# Patient Record
Sex: Female | Born: 1955
Health system: Southern US, Community
[De-identification: ages and names within clinical notes are randomized; demographics above are authoritative.]

## PROBLEM LIST (undated history)

## (undated) DIAGNOSIS — I38 Endocarditis, valve unspecified: Secondary | ICD-10-CM

## (undated) DIAGNOSIS — M810 Age-related osteoporosis without current pathological fracture: Secondary | ICD-10-CM

## (undated) DIAGNOSIS — K089 Disorder of teeth and supporting structures, unspecified: Secondary | ICD-10-CM

## (undated) DIAGNOSIS — I10 Essential (primary) hypertension: Secondary | ICD-10-CM

## (undated) HISTORY — DX: Disorder of teeth and supporting structures, unspecified: K08.9

## (undated) HISTORY — PX: COLONOSCOPY: SHX174

## (undated) HISTORY — DX: Age-related osteoporosis without current pathological fracture: M81.0

## (undated) HISTORY — PX: OTHER SURGICAL HISTORY: SHX169

## (undated) HISTORY — DX: Endocarditis, valve unspecified: I38

---

## 2006-01-02 ENCOUNTER — Ambulatory Visit: Payer: Self-pay | Admitting: Internal Medicine

## 2006-01-11 ENCOUNTER — Ambulatory Visit: Payer: Self-pay | Admitting: Internal Medicine

## 2006-02-27 ENCOUNTER — Ambulatory Visit: Payer: Self-pay | Admitting: Internal Medicine

## 2006-03-14 ENCOUNTER — Encounter: Payer: Self-pay | Admitting: Cardiovascular Disease

## 2006-03-14 ENCOUNTER — Ambulatory Visit: Payer: Self-pay

## 2006-03-14 ENCOUNTER — Encounter: Payer: Self-pay | Admitting: Internal Medicine

## 2006-04-17 ENCOUNTER — Ambulatory Visit: Payer: Self-pay | Admitting: Internal Medicine

## 2006-11-03 ENCOUNTER — Ambulatory Visit: Payer: Self-pay | Admitting: Internal Medicine

## 2006-11-16 ENCOUNTER — Ambulatory Visit: Payer: Self-pay | Admitting: Internal Medicine

## 2006-12-14 DIAGNOSIS — I10 Essential (primary) hypertension: Secondary | ICD-10-CM

## 2006-12-14 DIAGNOSIS — R030 Elevated blood-pressure reading, without diagnosis of hypertension: Secondary | ICD-10-CM | POA: Insufficient documentation

## 2006-12-14 DIAGNOSIS — R9389 Abnormal findings on diagnostic imaging of other specified body structures: Secondary | ICD-10-CM | POA: Insufficient documentation

## 2007-03-01 ENCOUNTER — Ambulatory Visit: Payer: Self-pay | Admitting: Internal Medicine

## 2007-03-30 LAB — CONVERTED CEMR LAB: Pap Smear: NORMAL

## 2007-07-03 ENCOUNTER — Ambulatory Visit: Payer: Self-pay | Admitting: Internal Medicine

## 2007-07-05 ENCOUNTER — Encounter: Payer: Self-pay | Admitting: Internal Medicine

## 2007-07-09 ENCOUNTER — Ambulatory Visit: Payer: Self-pay | Admitting: Internal Medicine

## 2007-07-12 LAB — CONVERTED CEMR LAB
AST: 24 units/L (ref 0–37)
BUN: 10 mg/dL (ref 6–23)
Basophils Relative: 0.9 % (ref 0.0–1.0)
CO2: 30 meq/L (ref 19–32)
Calcium: 9.6 mg/dL (ref 8.4–10.5)
Cholesterol: 173 mg/dL (ref 0–200)
Eosinophils Absolute: 0.2 10*3/uL (ref 0.0–0.6)
GFR calc Af Amer: 134 mL/min
GFR calc non Af Amer: 111 mL/min
HDL: 66.4 mg/dL (ref >39.0)
Hemoglobin: 14 g/dL (ref 12.0–15.0)
Lymphocytes Relative: 46 % (ref 12.0–46.0)
MCHC: 34.5 g/dL (ref 30.0–36.0)
MCV: 86.2 fL (ref 78.0–100.0)
Monocytes Absolute: 0.3 10*3/uL (ref 0.2–0.7)
Monocytes Relative: 8.6 % (ref 3.0–11.0)
Neutro Abs: 1.3 10*3/uL — ABNORMAL LOW (ref 1.4–7.7)
Platelets: 192 10*3/uL (ref 150–400)
Potassium: 4.5 meq/L (ref 3.5–5.1)
TSH: 0.97 microintl units/mL (ref 0.35–5.50)
Triglycerides: 72 mg/dL (ref 0–149)
VLDL: 14 mg/dL (ref 0–40)

## 2008-01-16 ENCOUNTER — Telehealth (INDEPENDENT_AMBULATORY_CARE_PROVIDER_SITE_OTHER): Payer: Self-pay | Admitting: *Deleted

## 2008-10-10 ENCOUNTER — Ambulatory Visit: Payer: Self-pay | Admitting: Internal Medicine

## 2008-10-13 ENCOUNTER — Ambulatory Visit: Payer: Self-pay | Admitting: Internal Medicine

## 2008-10-13 LAB — CONVERTED CEMR LAB: Vit D, 25-Hydroxy: 55 ng/mL (ref 30–89)

## 2008-10-16 LAB — CONVERTED CEMR LAB
ALT: 24 units/L (ref 0–35)
BUN: 9 mg/dL (ref 6–23)
CO2: 27 meq/L (ref 19–32)
Eosinophils Relative: 5.9 % — ABNORMAL HIGH (ref 0.0–5.0)
Glucose, Bld: 94 mg/dL (ref 70–99)
HDL: 69.4 mg/dL (ref >39.0)
Hemoglobin: 14.2 g/dL (ref 12.0–15.0)
LDL Cholesterol: 93 mg/dL (ref 0–99)
Lymphocytes Relative: 52.5 % — ABNORMAL HIGH (ref 12.0–46.0)
Monocytes Relative: 8.4 % (ref 3.0–12.0)
Platelets: 173 10*3/uL (ref 150–400)
Potassium: 4.3 meq/L (ref 3.5–5.1)
RDW: 12.4 % (ref 11.5–14.6)
Sodium: 140 meq/L (ref 135–145)
TSH: 1.1 microintl units/mL (ref 0.35–5.50)
Total CHOL/HDL Ratio: 2.5
Triglycerides: 52 mg/dL (ref 0–149)
WBC: 3.6 10*3/uL — ABNORMAL LOW (ref 4.5–10.5)

## 2009-05-29 LAB — CONVERTED CEMR LAB: Pap Smear: NORMAL

## 2009-10-30 ENCOUNTER — Ambulatory Visit: Payer: Self-pay | Admitting: Internal Medicine

## 2009-11-02 ENCOUNTER — Ambulatory Visit: Payer: Self-pay | Admitting: Internal Medicine

## 2009-11-02 ENCOUNTER — Encounter (INDEPENDENT_AMBULATORY_CARE_PROVIDER_SITE_OTHER): Payer: Self-pay | Admitting: *Deleted

## 2009-11-02 LAB — CONVERTED CEMR LAB
Glucose, Urine, Semiquant: NEGATIVE
Specific Gravity, Urine: 1.02
WBC Urine, dipstick: NEGATIVE
pH: 6

## 2009-11-03 ENCOUNTER — Telehealth: Payer: Self-pay | Admitting: Internal Medicine

## 2009-11-04 ENCOUNTER — Encounter: Payer: Self-pay | Admitting: Internal Medicine

## 2009-11-05 LAB — CONVERTED CEMR LAB
BUN: 8 mg/dL (ref 6–23)
Basophils Relative: 0.9 % (ref 0.0–3.0)
Chloride: 108 meq/L (ref 96–112)
Eosinophils Relative: 5.3 % — ABNORMAL HIGH (ref 0.0–5.0)
GFR calc non Af Amer: 92.08 mL/min (ref >60)
Glucose, Bld: 91 mg/dL (ref 70–99)
HCT: 41.3 % (ref 36.0–46.0)
Hemoglobin: 13.8 g/dL (ref 12.0–15.0)
MCV: 88.6 fL (ref 78.0–100.0)
Monocytes Absolute: 0.3 10*3/uL (ref 0.1–1.0)
Neutrophils Relative %: 38.1 % — ABNORMAL LOW (ref 43.0–77.0)
Potassium: 4.2 meq/L (ref 3.5–5.1)
RBC: 4.66 M/uL (ref 3.87–5.11)
VLDL: 20.2 mg/dL (ref 0.0–40.0)
WBC: 3.5 10*3/uL — ABNORMAL LOW (ref 4.5–10.5)

## 2009-11-09 ENCOUNTER — Encounter: Payer: Self-pay | Admitting: Internal Medicine

## 2009-11-09 ENCOUNTER — Ambulatory Visit: Payer: Self-pay

## 2009-11-17 ENCOUNTER — Encounter (INDEPENDENT_AMBULATORY_CARE_PROVIDER_SITE_OTHER): Payer: Self-pay | Admitting: *Deleted

## 2009-11-18 ENCOUNTER — Ambulatory Visit: Payer: Self-pay | Admitting: Internal Medicine

## 2009-12-02 ENCOUNTER — Ambulatory Visit: Payer: Self-pay | Admitting: Internal Medicine

## 2009-12-07 ENCOUNTER — Encounter: Payer: Self-pay | Admitting: Internal Medicine

## 2010-05-31 ENCOUNTER — Encounter: Payer: Self-pay | Admitting: Internal Medicine

## 2010-06-01 ENCOUNTER — Encounter: Payer: Self-pay | Admitting: Internal Medicine

## 2010-06-16 ENCOUNTER — Encounter: Payer: Self-pay | Admitting: Internal Medicine

## 2010-06-22 DIAGNOSIS — R000 Tachycardia, unspecified: Secondary | ICD-10-CM | POA: Insufficient documentation

## 2010-06-23 ENCOUNTER — Ambulatory Visit: Payer: Self-pay | Admitting: Internal Medicine

## 2010-06-29 ENCOUNTER — Encounter: Payer: Self-pay | Admitting: Internal Medicine

## 2010-06-29 ENCOUNTER — Ambulatory Visit: Payer: Self-pay | Admitting: Cardiovascular Disease

## 2010-06-29 ENCOUNTER — Ambulatory Visit: Payer: Self-pay

## 2010-06-29 ENCOUNTER — Ambulatory Visit (HOSPITAL_COMMUNITY): Admission: RE | Admit: 2010-06-29 | Discharge: 2010-06-29 | Payer: Self-pay | Admitting: Internal Medicine

## 2010-06-30 ENCOUNTER — Telehealth: Payer: Self-pay | Admitting: Internal Medicine

## 2010-08-06 ENCOUNTER — Ambulatory Visit: Payer: Self-pay | Admitting: Internal Medicine

## 2010-09-28 NOTE — Progress Notes (Signed)
Summary: test results  Phone Note Call from Patient Call back at Home Phone [phone]   Caller: Patient Reason for Call: Lab or Test Results Initial call taken by: Judie Grieve,  June 30, 2010 3:17 PM  Follow-up for Phone Call        lmom with normal results Dennis Bast, RN, BSN  July 01, 2010 10:15 AM

## 2010-09-28 NOTE — Procedures (Signed)
Summary: Colonoscopy  Patient: Melissa Russell Note: All result statuses are Final unless otherwise noted.  Tests: (1) Colonoscopy (COL)   COL Colonoscopy           DONE     [facility]     [address]     [place], [place]  [address]           COLONOSCOPY PROCEDURE REPORT           PATIENT:  Melissa, Russell  MR#:  [id number]     BIRTHDATE:  October 15, 1955, 53 yrs. old  GENDER:  female     ENDOSCOPIST:  Iva Boop, MD, [facility]     REF. BY:  Willow Ora, M.D.     PROCEDURE DATE:  12/02/2009     PROCEDURE:  Colonoscopy with snare polypectomy     ASA CLASS:  Class II     INDICATIONS:  Elevated Risk Screening, family history of colon     cancer father diagnosed with colon cancer early 24's, age of onset     not clear but in 88's presumably     MEDICATIONS:   Fentanyl 75 mcg IV, Versed 7 mg IV           DESCRIPTION OF PROCEDURE:   After the risks benefits and     alternatives of the procedure were thoroughly explained, informed     consent was obtained.  Digital rectal exam was performed and     revealed no abnormalities.   The LB CF-[id number] endoscope     was introduced through the anus and advanced to the cecum, which     was identified by both the appendix and ileocecal valve, without     limitations.  The quality of the prep was good, using MoviPrep.     The instrument was then slowly withdrawn as the colon was fully     examined. Insertion: 5:14 mins Withdrawal: 9:51 mins     <<PROCEDUREIMAGES>>           FINDINGS:  A diminutive polyp was found at the splenic flexure. It     was 5 mm in size. Polyp was snared without cautery. Retrieval was     successful. Moderate diverticulosis was found in the sigmoid     colon.  This was otherwise a normal examination of the colon.     Retroflexed views in the rectum revealed no abnormalities.    The     scope was then withdrawn from the patient and the procedure     completed.           COMPLICATIONS:  None     ENDOSCOPIC  IMPRESSION:     1) 5 mm diminutive polyp at the splenic flexure - removed     2) Moderate diverticulosis in the sigmoid colon     3) Otherwise normal examination - excellent prep           4) Family history of colon cancer - father           REPEAT EXAM:  In for Colonoscopy, pending biopsy results. likely 5     years           Iva Boop, MD, [facility]           CC:  Willow Ora, MD     The Patient           n.     eSIGNED:   Iva Boop at 12/02/2009 11:30  AM           Cammy Copa, [id number]  Note: An exclamation mark (!) indicates a result that was not dispersed into the flowsheet. Document Creation Date: 12/02/2009 11:31 AM _______________________________________________________________________  (1) Order result status: Final Collection or observation date-time: 12/02/2009 11:21 Requested date-time:  Receipt date-time:  Reported date-time:  Referring Physician:   Ordering Physician: Stan Head [phone]) Specimen Source:  Source: Launa Grill Order Number: [id number] Lab site:   Appended Document: Colonoscopy     Procedures Next Due Date:    Colonoscopy: 11/2014

## 2010-09-28 NOTE — Miscellaneous (Signed)
Summary: [facility] Previsit/prep  Clinical Lists Changes  Medications: Added new medication of MOVIPREP 100 GM  SOLR (PEG-KCL-NACL-NASULF-NA ASC-C) As per prep instructions. - Signed Rx of MOVIPREP 100 GM  SOLR (PEG-KCL-NACL-NASULF-NA ASC-C) As per prep instructions.;  #1 x 0;  Signed;  Entered by: Wyona Almas RN;  Authorized by: Iva Boop MD, [facility];  Method used: [phone] [address] [place], [place], [place]  [address], Ph: [phone] or [phone], Fax: [phone] Observations: Added new observation of ALLERGY REV: Done (11/18/2009 13:08)    Prescriptions: MOVIPREP 100 GM  SOLR (PEG-KCL-NACL-NASULF-NA ASC-C) As per prep instructions.  #1 x 0   Entered by:   Wyona Almas RN   Authorized by:   Iva Boop MD, [facility]   Signed by:   Wyona Almas RN on 11/18/2009   Method used:   [facility]        [facility] [phone]ail)       [address]       [place], [place]  [address]       Ph: [phone] or [phone]       Fax: [phone]   RxID:   [id number]

## 2010-09-28 NOTE — Assessment & Plan Note (Signed)
Summary: Melissa Russell///SPH   Vital Signs:  Patient profile:   55 year old female Height:      66 inches Weight:      129.2 pounds BMI:     20.93 Pulse rate:   96 / minute BP sitting:   144 / 82  Vitals Entered By: Shary Decamp (October 30, 2009 1:57 PM) CC: Melissa Russell - no pap   History of Present Illness: Melissa Russell patient wonders if she is loosing hair  from metoprolol  Preventive Screening-Counseling & Management  Caffeine-Diet-Exercise     Caffeine use/day: 0     Does Patient Exercise: yes     Times/week: 5      Drug Use:  no.    Allergies: 1)  ! Augmentin  Past History:  Past Medical History: Reviewed history from 12/14/2006 and no changes required. Anxiety Hypertension  Past Surgical History: Reviewed history from 10/10/2008 and no changes required. 7-07: ECHO EF 55%, minimal MR, CARDIOLITE NEG - INCREASED BP  Family History: Reviewed history from 10/10/2008 and no changes required. colon ca-- father Dx age 64 aprox  Breast ca--no MOTHER - BLADDER CA DM - AUNT MI--no Aneurysm (chest)-- bro  Social History: Reviewed history from 10/10/2008 and no changes required. Married no kids@ [employer]  Born in [place], [place] ; husband from [place]  tobacco--no ETOH-- socially  exercise-- walks daily weather permit  diet-- healthy Does Patient Exercise:  yes Drug Use:  no Caffeine use/day:  0  Review of Systems General:  Denies fever and weight loss. CV:  Denies chest pain or discomfort and swelling of feet. Resp:  Denies cough and wheezing. GI:  Denies bloody stools, diarrhea, and nausea. GU:  sees gyn does SBE . Psych:  Denies anxiety and depression.  Physical Exam  General:  alert, well-developed, and well-nourished.   Neck:  no masses, no thyromegaly, and normal carotid upstroke.   Lungs:  normal respiratory effort, no intercostal retractions, no accessory muscle use, and normal breath sounds.   Heart:  normal rate, regular rhythm, no  murmur, and no gallop.   Abdomen:  soft, non-tender, no distention, no guarding, and no rigidity.  palpable nontender aorta in the epigastric area.  No bruit Extremities:  no edema Psych:  Cognition and judgment appear intact. Alert and cooperative with normal attention span and concentration.  not anxious appearing and not depressed appearing.     Impression & Recommendations:  Problem # 1:  HEALTH SCREENING (ICD-V70.0)  Td 07 had a colonoscopy in 2005 apparently for screening purpose, was done in [place] father has a h/o  colon cancer recommend colonoscopy female care per gynecology  has a healthy lifestyle-- praised loosing hair from metoprolol? issue search in Up-to-date, it is unlikely that she's loosing hair from  metoprolol palpable Ao (noted also 2 years ago) -- r/o AAA, rec U/S , patient quite reluctant, will think about it   Orders: Gastroenterology Referral (GI)  Complete Medication List: 1)  Metoprolol Succinate 50 Mg Tb24 (Metoprolol succinate) .... One p.o. b.i.d. 2)  Calcium and Vitd Otc Daily   Patient Instructions: 1)  please come back fasting 2)  FLP, CBC, TSH, BMP, AST ALT UA and Udip ----Dx  complete physical 3)  Please schedule a follow-up appointment in 1 year.  Prescriptions: METOPROLOL SUCCINATE 50 MG  TB24 (METOPROLOL SUCCINATE) one p.o. b.i.d.  #180 x 3   Entered and Authorized by:   Nolon Rod. Shawniece Oyola MD   Signed by:   Elita Quick  Elisabeth Cara MD on 10/30/2009   Method used:   Print then Give to Patient   RxID:   [id number]    Preventive Care Screening  Mammogram:    Date:  05/29/2009    Results:  normal   Pap Smear:    Date:  05/29/2009    Results:  normal   Prior Values:    Pap Smear:  normal (04/29/2008)    Mammogram:  normal (04/29/2008)    Colonoscopy:  normal - per pt (08/30/2003)    Last Tetanus Booster:  Historical (01/02/2006)     Risk Factors:  Tobacco use:  never Drug use:  no Caffeine use:  0 drinks per day Alcohol use:  yes     Type:  SOCIALLY Exercise:  yes    Times per week:  5  Colonoscopy History:    Date of Last Colonoscopy:  08/30/2003  Mammogram History:     Date of Last Mammogram:  05/29/2009    Results:  normal   PAP Smear History:     Date of Last PAP Smear:  05/29/2009    Results:  normal

## 2010-09-28 NOTE — Medication Information (Signed)
Summary: Nonadherence with Metoprolol/[facility]  Nonadherence with Metoprolol/[facility]   Imported By: Lanelle Bal 06/10/2010 09:02:50  _____________________________________________________________________  External Attachment:    Type:   Image     Comment:   External Document

## 2010-09-28 NOTE — Miscellaneous (Signed)
Summary: Orders Update  Clinical Lists Changes  Orders: Added new Test order of Abdominal Aorta Duplex (Abd Aorta Duplex) - Signed 

## 2010-09-28 NOTE — Assessment & Plan Note (Signed)
Summary: nep/ tackycardia pt has bcbs/ gd   Visit Type:  Initial Consult Primary Provider:  Jackalyn Lombard, MD   History of Present Illness: [name] is a pleasant 55 yo WF with a h/o HTN who presents today for cardiology consultation.  She is referred for evaluation of palpitations.  She reports initially being diagnosed with HTN four years ago.  She was placed on metoprolol at that time.  She reports having alopecia, fatigue, and consitpiation which she attributed to metoprolol.  She stopped metoprolol 8/11 and was placed on lisinopril.  Since starting starting lisnopril, she reports developing orthostatic dizziness and palpitations.  She describes her palpitations as an awareness of her heart "pounding" and beating "fast".  She was taken off of lisinopril and initiated on bystolic.  She states that since this change, she has done very well.  Her heart "pounding" and dizziness have resolved.  She has returned to her previous health state and is otherwise without complaint today. She denies symptoms of  chest pain, shortness of breath, orthopnea, PND, lower extremity edema,  presyncope, syncope, or neurologic sequela. The patient is  otherwise without complaint today.   Current Medications (verified): 1)  Bystolic 5 Mg Tabs (Nebivolol Hcl) .... Once Daily  Allergies: 1)  ! Augmentin  Past History:  Past Medical History: Hypertension diverticulosis Hyperlipidemia (pt unaware of this)  Past Surgical History: 7-07: ECHO EF 55%, minimal MR, CARDIOLITE NEG - INCREASED BP  no surgeries  Family History: colon ca-- father Dx age 94 aprox  Breast ca--no MOTHER - BLADDER CA DM - AUNT MI--no Brother died of a ruptured dissection of the aorta  Social History: Married and lives in [place]. volunteers, worked as a Engineer, site in [place] Born in [place], [place] ; husband from [place]  tobacco--no ETOH-- socially  exercise-- walks daily weather permit  diet-- healthy    Review of Systems       All systems are reviewed and negative except as listed in the HPI.   Vital Signs:  Patient profile:   55 year old female Height:      66 inches Weight:      128 pounds BMI:     20.73 Pulse rate:   96 / minute BP sitting:   138 / 80  (left arm)  Vitals Entered By: Laurance Flatten CMA (June 23, 2010 3:43 PM)  Physical Exam  General:  thin Head:  normocephalic and atraumatic Eyes:  PERRLA/EOM intact; conjunctiva and lids normal. Mouth:  Teeth, gums and palate normal. Oral mucosa normal. Neck:  Neck supple, no JVD. No masses, thyromegaly or abnormal cervical nodes. Lungs:  Clear bilaterally to auscultation and percussion. Heart:  Non-displaced PMI, chest non-tender; regular rate and rhythm, S1, S2 without murmurs, rubs or gallops. Carotid upstroke normal, no bruit. Normal abdominal aortic size, no bruits. Femorals normal pulses, no bruits. Pedals normal pulses. No edema, no varicosities. Abdomen:  Bowel sounds positive; abdomen soft and non-tender without masses, organomegaly, or hernias noted. No hepatosplenomegaly. Msk:  Back normal, normal gait. Muscle strength and tone normal. Pulses:  pulses normal in all 4 extremities Extremities:  No clubbing or cyanosis. Neurologic:  Alert and oriented x 3. Skin:  Intact without lesions or rashes. Cervical Nodes:  no significant adenopathy Psych:  Normal affect.   EKG  Procedure date:  05/31/2010  Findings:      sinus rhythm 87 bpm, PR 134, QRS 80, QTc 411, otherwise normal ekg  Impression & Recommendations:  Problem # 1:  HYPERTENSION (ICD-401.9) Ms Goens' blood pressure is above goal today.  I had a long discussion with her about the importance of good BP control.  She states that she does not like to take medicines and is very reluctant to make any changes today.  I have recommended that we simply increase bystolic today as she is tolerating this medicine presently, though she declines. I have  recommended salt restriction, avoidance of NSAIDs, and avoidance of decongestants this winter. She will keep a log of BP recordings over the next few weeks and will return with these for further consideration of adjusting BP medication in several weeks.   She has a slightly marfanoid body habitous and a brother that died or arotic dissection.  I have reviewed her abdominal US from 3/11 which was normal.  We will repeat an echo at this time to evaluate the aortic root.  If this is normal, no further testing is planned at this time.  Problem # 2:  TACHYCARDIA (ICD-785.0) Assessment: Unchanged She states that her palpitations have resolved with bystolic and upon cessation of her ACE inhibitor. I have made no further changes today. We will repeat an echo (I have reviewed echo and nuclear study from 2007 which were both normal at that time).  If her echo is normal, no further workup is planned unless symptoms return.  Other Orders: Echocardiogram (Echo)  Patient Instructions: 1)  Your physician recommends that you schedule a follow-up appointment in: 6 WEEKS WITH DR Johney Frame 2)  Your physician has requested that you have an echocardiogram.  Echocardiography is a painless test that uses sound waves to create images of your heart. It provides your doctor with information about the size and shape of your heart and how well your heart's chambers and valves are working.  This procedure takes approximately one hour. There are no restrictions for this procedure. 3)  Your physician has requested that you regularly monitor and record your blood pressure readings at home.  Please use the same machine at the same time of day to check your readings and record them to bring to your follow-up visit.

## 2010-09-28 NOTE — Progress Notes (Signed)
Summary: PATIENT REQUEST U/S  Phone Note Call from Patient   Caller: Patient Call For: Jose E. Paz MD Reason for Call: Referral Summary of Call: PATIENT CALLING, IS NOW INTERESTED IN HAVING THE U/S TO LOOK AT HER AAA, AS DISCUSSED AT LAST OV.  OK TO REFER? Initial call taken by: Magdalen Spatz [facility],  November 03, 2009 8:29 AM  Follow-up for Phone Call        yes Follow-up by: Shary Decamp,  November 03, 2009 8:38 AM

## 2010-09-28 NOTE — Letter (Signed)
Summary: [facility] Instructions  [facility]  [address] [place], [place] [address]   Phone: [phone]  Fax: [phone]       Melissa Russell    12-08-1955    MRN: [id number]        Procedure Day [name]:  [facility]  12/02/09     Arrival Time:  10:00AM     Procedure Time:  11:00AM     Location of Procedure:                    _X _  [facility] (4th Floor)                        PREPARATION FOR COLONOSCOPY WITH MOVIPREP   Starting 5 days prior to your procedure 11/27/09 do not eat nuts, seeds, popcorn, corn, beans, peas,  salads, or any raw vegetables.  Do not take any fiber supplements (e.g. Metamucil, Citrucel, and Benefiber).  THE DAY BEFORE YOUR PROCEDURE         DATE: 12/01/09  DAY: TUESDAY  1.  Drink clear liquids the entire day-NO SOLID FOOD  2.  Do not drink anything colored red or purple.  Avoid juices with pulp.  No orange juice.  3.  Drink at least 64 oz. (8 glasses) of fluid/clear liquids during the day to prevent dehydration and help the prep work efficiently.  CLEAR LIQUIDS INCLUDE: Water Jello Ice Popsicles Tea (sugar ok, no milk/cream) Powdered fruit flavored drinks Coffee (sugar ok, no milk/cream) Gatorade Juice: apple, white grape, white cranberry  Lemonade Clear bullion, consomm, broth Carbonated beverages (any kind) Strained chicken noodle soup Hard Candy                             4.  In the morning, mix first dose of MoviPrep solution:    Empty 1 Pouch A and 1 Pouch B into the disposable container    Add lukewarm drinking water to the top line of the container. Mix to dissolve    Refrigerate (mixed solution should be used within 24 hrs)  5.  Begin drinking the prep at 5:00 p.m. The MoviPrep container is divided by 4 marks.   Every 15 minutes drink the solution down to the next mark (approximately 8 oz) until the full liter is complete.   6.  Follow completed prep with 16 oz of clear liquid of your choice  (Nothing red or purple).  Continue to drink clear liquids until bedtime.  7.  Before going to bed, mix second dose of MoviPrep solution:    Empty 1 Pouch A and 1 Pouch B into the disposable container    Add lukewarm drinking water to the top line of the container. Mix to dissolve    Refrigerate  THE DAY OF YOUR PROCEDURE      DATE: 12/02/09  DAY: WEDNESDAY  Beginning at 6:00a.m. (5 hours before procedure):         1. Every 15 minutes, drink the solution down to the next mark (approx 8 oz) until the full liter is complete.  2. Follow completed prep with 16 oz. of clear liquid of your choice.    3. You may drink clear liquids until 9:00AM (2 HOURS BEFORE PROCEDURE).   MEDICATION INSTRUCTIONS  Unless otherwise instructed, you should take regular prescription medications with a small sip of water   as early as possible the morning  of your procedure.         OTHER INSTRUCTIONS  You will need a responsible adult at least 57 years of age to accompany you and drive you home.   This person must remain in the waiting room during your procedure.  Wear loose fitting clothing that is easily removed.  Leave jewelry and other valuables at home.  However, you may wish to bring a book to read or  an iPod/MP3 player to listen to music as you wait for your procedure to start.  Remove all body piercing jewelry and leave at home.  Total time from sign-in until discharge is approximately 2-3 hours.  You should go home directly after your procedure and rest.  You can resume normal activities the  day after your procedure.  The day of your procedure you should not:   Drive   Make legal decisions   Operate machinery   Drink alcohol   Return to work  You will receive specific instructions about eating, activities and medications before you leave.    The above instructions have been reviewed and explained to me by  Wyona Almas RN  November 18, 2009 2:07 PM     I fully  understand and can verbalize these instructions _____________________________ Date _________

## 2010-09-28 NOTE — Letter (Signed)
Summary: Previsit letter  [facility]  [address] [place], [place] [address]   Phone: [phone]  Fax: [phone]       11/02/2009 MRN: [id number]  TACARA HADLOCK [address] [name], [place]  [address]  Dear Ms. Mone,  Welcome to the Gastroenterology Division at [facility].    You are scheduled to see a nurse for your pre-procedure visit on 11-18-09 at 1:30p.m. on the 3rd floor at [facility], [address]  We ask that you try to arrive at our office 15 minutes prior to your appointment time to allow for check-in.  Your nurse visit will consist of discussing your medical and surgical history, your immediate family medical history, and your medications.    Please bring a complete list of all your medications or, if you prefer, bring the medication bottles and we will list them.  We will need to be aware of both prescribed and over the counter drugs.  We will need to know exact dosage information as well.  If you are on blood thinners (Coumadin, Plavix, Aggrenox, Ticlid, etc.) please call our office today/prior to your appointment, as we need to consult with your physician about holding your medication.   Please be prepared to read and sign documents such as consent forms, a financial agreement, and acknowledgement forms.  If necessary, and with your consent, a friend or relative is welcome to sit-in on the nurse visit with you.  Please bring your insurance card so that we may make a copy of it.  If your insurance requires a referral to see a specialist, please bring your referral form from your primary care physician.  No co-pay is required for this nurse visit.     If you cannot keep your appointment, please call [phone] to cancel or reschedule prior to your appointment date.  This allows [place] the opportunity to schedule an appointment for another patient in need of care.    Thank you for choosing [facility] for your medical  needs.  We appreciate the opportunity to care for you.  Please visit [place] at our website  to learn more about our practice.                     Sincerely.                                                                                                                   The Gastroenterology Division

## 2010-09-28 NOTE — Letter (Signed)
Summary: [facility] Note   [facility] Note   Imported By: Roderic Ovens 07/06/2010 13:31:52  _____________________________________________________________________  External Attachment:    Type:   Image     Comment:   External Document

## 2010-09-28 NOTE — Letter (Signed)
Summary: Patient Notice-Hyperplastic Polyps  [facility]  [address] [place], [place] [address]   Phone: [phone]  Fax: [phone]        December 07, 2009 MRN: [id number]    Melissa Russell [address] [name], [place]  [address]    Dear [name],  I am pleased to inform you that the colon polyp(s) removed during your recent colonoscopy was (were) found to be hyperplastic.  These types of polyps are NOT pre-cancerous.  It is my recommendation that you have a repeat colonoscopy examination in 5 years for routine colorectal cancer screening. (Since your father had colon cancer would do at 5 years not 10).  In addition to repeating colonoscopy, changing health habits may reduce your risk of having colon polyps and possibly, colon cancer. You may lower your risk of future polyps and colon cancer by adopting healthy habits such as not smoking or using tobacco (if you do), being physically active, losing weight (if overweight), and eating a diet which includes fruits and vegetables and limits red meat.  Should you develop new or worsening symptoms of abdominal pain, bowel habit changes or bleeding from the rectum or bowels, please schedule an evaluation with either your primary care physician or with me.  Please call us if you are having persistent problems or have questions about your condition that have not been fully answered at this time.  Sincerely,  Iva Boop MD, [facility] This letter has been electronically signed by your physician.  Appended Document: Patient Notice-Hyperplastic Polyps letter mailed 4.11.11

## 2010-09-28 NOTE — Assessment & Plan Note (Signed)
Summary: 6 weeks@ 2pm/sl   Visit Type:  Follow-up Primary Provider:  Jackalyn Lombard, MD   History of Present Illness: The patient presents today for routine electrophysiology followup. She reports doing very well since last being seen in our clinic. The patient denies symptoms of palpitations, chest pain, shortness of breath, orthopnea, PND, lower extremity edema, dizziness, presyncope, syncope, or neurologic sequela. The patient is tolerating medications without difficulties and is otherwise without complaint today.   Current Medications (verified): 1)  Bystolic 5 Mg Tabs (Nebivolol Hcl) .... Once Daily  Allergies: 1)  ! Augmentin  Past History:  Past Medical History: Reviewed history from 06/23/2010 and no changes required. Hypertension diverticulosis Hyperlipidemia (pt unaware of this)  Past Surgical History: no surgeries  Social History: Reviewed history from 06/23/2010 and no changes required. Married and lives in [employer]. volunteers, worked as a Engineer, site in [place] Born in [place], [place] ; husband from [place]  tobacco--no ETOH-- socially  exercise-- walks daily weather permit  diet-- healthy   Vital Signs:  Patient profile:   55 year old female Height:      66 inches Weight:      130 pounds BMI:     21.06 Pulse rate:   108 / minute BP sitting:   140 / 86  (left arm)  Vitals Entered By: Laurance Flatten CMA (August 06, 2010 1:53 PM)  Physical Exam  General:  thin Head:  normocephalic and atraumatic Eyes:  PERRLA/EOM intact; conjunctiva and lids normal. Mouth:  Teeth, gums and palate normal. Oral mucosa normal. Neck:  Neck supple, no JVD. No masses, thyromegaly or abnormal cervical nodes. Lungs:  Clear bilaterally to auscultation and percussion. Heart:  Non-displaced PMI, chest non-tender; regular rate and rhythm, S1, S2 without murmurs, rubs or gallops. Carotid upstroke normal, no bruit. Normal abdominal aortic size, no bruits. Femorals  normal pulses, no bruits. Pedals normal pulses. No edema, no varicosities. Abdomen:  Bowel sounds positive; abdomen soft and non-tender without masses, organomegaly, or hernias noted. No hepatosplenomegaly. Msk:  Back normal, normal gait. Muscle strength and tone normal. Pulses:  pulses normal in all 4 extremities Extremities:  No clubbing or cyanosis. Neurologic:  Alert and oriented x 3.   Nuclear Study  Procedure date:  03/14/2006  Findings:        Impression:  EF: 70%  Exercise Capacity: Good exercise capacity. Blood Pressure Response: Hypertensive blood pressure response. Clinical Symptoms: No chest pain or dyspnea. ECG Impression: No ST abnormalities suggestive of ischemia.  Overall Impression: Normal stress nuclear study,  Peter C. Eden Emms, MD      Impression & Recommendations:  Problem # 1:  HYPERTENSION (ICD-401.9) stable she presents with BP journal from home which reveals  BP mostly 120s/70s no changes today  recent echo reviewed which revealed no significant aortic diliatation.  Problem # 2:  TACHYCARDIA (ICD-785.0) resolved  Patient Instructions: 1)  Your physician wants you to follow-up in: 12 months with Dr Jacquiline Doe will receive a reminder letter in the mail two months in advance. If you don't receive a letter, please call our office to schedule the follow-up appointment. 2)  Your physician recommends that you continue on your current medications as directed. Please refer to the Current Medication list given to you today. Prescriptions: BYSTOLIC 5 MG TABS (NEBIVOLOL HCL) once daily  #30 x 11   Entered by:   Dennis Bast, RN, BSN   Authorized by:   Hillis Range, MD   Signed by:  Dennis Bast, RN, BSN on 08/06/2010   Method used:   Electronically to        [facility]  [address] [facility] [phone]* (retail)       [address]       [place], [place]  [address]       Ph: [phone] or [phone]       Fax: [phone]   RxID:   [id number]

## 2010-09-28 NOTE — Miscellaneous (Signed)
Summary: Orders Update  Clinical Lists Changes  Problems: Added new problem of Question of  AAA (ICD-441.4) - Signed Orders: Added new Referral order of Radiology Referral (Radiology) - Signed

## 2010-10-11 ENCOUNTER — Telehealth: Payer: Self-pay | Admitting: Internal Medicine

## 2010-10-20 ENCOUNTER — Other Ambulatory Visit: Payer: Self-pay | Admitting: Obstetrics and Gynecology

## 2010-10-20 NOTE — Progress Notes (Signed)
Summary: refill med  Phone Note Refill Request   Refills Requested: Medication #1:  BYSTOLIC 5 MG TABS once daily.   Supply Requested: 3 months pt need a 90 day supply.    Method Requested: Fax to Local Pharmacy Initial call taken by: Lorne Skeens,  October 11, 2010 10:54 AM [facility]er[facility]  [phone][facility] Request: Speak with Nurse    Prescriptions: BYSTOLIC 5 MG TABS (NEBIVOLOL HCL) once daily  #90 x 3   Entered by:   Dennis Bast, RN, BSN   Authorized by:   Hillis Range, MD   Signed by:   Dennis Bast, RN, BSN on 10/11/2010   Method used:   Electronically to        [facility]  [address] [phone]* (retail)       [address]       [place], [place]  [address]       Ph: [phone] or [phone]       Fax: [phone]   RxID:   [id number]

## 2011-10-05 ENCOUNTER — Other Ambulatory Visit: Payer: Self-pay | Admitting: Internal Medicine

## 2011-10-05 MED ORDER — NEBIVOLOL HCL 5 MG PO TABS: 5.0000 mg | ORAL_TABLET | Freq: Every day | ORAL | Status: DC

## 2012-04-18 ENCOUNTER — Encounter (HOSPITAL_BASED_OUTPATIENT_CLINIC_OR_DEPARTMENT_OTHER): Payer: Self-pay | Admitting: *Deleted

## 2012-04-18 ENCOUNTER — Emergency Department (HOSPITAL_BASED_OUTPATIENT_CLINIC_OR_DEPARTMENT_OTHER)
Admission: EM | Admit: 2012-04-18 | Discharge: 2012-04-18 | Disposition: A | Discharge status: Home / Self Care | Payer: BC Managed Care – PPO | Attending: Emergency Medicine | Admitting: Emergency Medicine

## 2012-04-18 DIAGNOSIS — R002 Palpitations: Secondary | ICD-10-CM

## 2012-04-18 DIAGNOSIS — I10 Essential (primary) hypertension: Secondary | ICD-10-CM | POA: Insufficient documentation

## 2012-04-18 HISTORY — DX: Essential (primary) hypertension: I10

## 2012-04-18 LAB — CBC WITH DIFFERENTIAL/PLATELET
Basophils Absolute: 0 10*3/uL (ref 0.0–0.1)
Basophils Relative: 1 % (ref 0–1)
Eosinophils Absolute: 0.2 10*3/uL (ref 0.0–0.7)
Lymphs Abs: 1.7 10*3/uL (ref 0.7–4.0)
MCH: 28.7 pg (ref 26.0–34.0)
Neutrophils Relative %: 56 % (ref 43–77)
Platelets: 187 10*3/uL (ref 150–400)
RBC: 4.91 MIL/uL (ref 3.87–5.11)
WBC: 5.2 10*3/uL (ref 4.0–10.5)

## 2012-04-18 LAB — COMPREHENSIVE METABOLIC PANEL
ALT: 16 U/L (ref 0–35)
AST: 19 U/L (ref 0–37)
Albumin: 4.2 g/dL (ref 3.5–5.2)
Alkaline Phosphatase: 71 U/L (ref 39–117)
Glucose, Bld: 145 mg/dL — ABNORMAL HIGH (ref 70–99)
Potassium: 3.6 mEq/L (ref 3.5–5.1)
Sodium: 140 mEq/L (ref 135–145)
Total Protein: 7.3 g/dL (ref 6.0–8.3)

## 2012-04-18 NOTE — ED Provider Notes (Signed)
History     CSN: [id number]  Arrival date & time 04/18/12  1903   First MD Initiated Contact with Patient 04/18/12 1959      Chief Complaint  Patient presents with  . Tachycardia    (Consider location/radiation/quality/duration/timing/severity/associated sxs/prior treatment) HPI Comments: Patient had just finished eating dinner when she started with heart racing and pounding.  This lasted for about 30 minutes and resolved spontaneously.  She says her chest felt "heavy" but does not report pain.  She has a history of htn and had a normal stress test 2 years ago.  Patient is a 56 y.o. female presenting with palpitations. The history is provided by the patient.  Palpitations  This is a new problem. Episode onset: 6 PM. The problem occurs constantly. The problem has been resolved. Associated with: eating. Pertinent negatives include no diaphoresis, no fever, no chest pressure, no near-syncope, no dizziness and no shortness of breath. She has tried nothing for the symptoms. Risk factors include no known risk factors.    Past Medical History  Diagnosis Date  . Hypertension     Past Surgical History  Procedure Date  . Tonsillectomy     No family history on file.  History  Substance Use Topics  . Smoking status: Never Smoker   . Smokeless tobacco: Never Used  . Alcohol Use: Yes     RARE    OB History    Grav Para Term Preterm Abortions TAB SAB Ect Mult Living                  Review of Systems  Constitutional: Negative for fever and diaphoresis.  Respiratory: Negative for shortness of breath.   Cardiovascular: Positive for palpitations. Negative for near-syncope.  Neurological: Negative for dizziness.  All other systems reviewed and are negative.    Allergies  Amoxicillin-pot clavulanate and Augmentin  Home Medications   Current Outpatient Rx  Name Route Sig Dispense Refill  . CO Q 10 PO Oral Take 1 tablet by mouth daily before supper.    . NEBIVOLOL HCL 5 MG  PO TABS Oral Take 2.5 mg by mouth daily. Pt needs OV for additional refills    . OVER THE COUNTER MEDICATION Oral Take 1 tablet by mouth daily. Hawthorn Berry supplement.    Frazier Butt OP Ophthalmic Apply 1 drop to eye daily.    Marland Kitchen VITAMIN B-1 100 MG PO TABS Oral Take 100 mg by mouth daily.      BP 143/69  Pulse 86  Temp 98 F (36.7 C)  Resp 20  Ht 5\' 5"  (1.651 m)  Wt 122 lb (55.339 kg)  BMI 20.30 kg/m2  SpO2 100%  Physical Exam  Nursing note and vitals reviewed. Constitutional: She is oriented to person, place, and time. She appears well-developed and well-nourished. No distress.  HENT:  Head: Normocephalic and atraumatic.  Neck: Normal range of motion. Neck supple.  Cardiovascular: Normal rate and regular rhythm.  Exam reveals no gallop and no friction rub.   No murmur heard. Pulmonary/Chest: Effort normal and breath sounds normal. No respiratory distress. She has no wheezes.  Abdominal: Soft. Bowel sounds are normal. She exhibits no distension. There is no tenderness.  Musculoskeletal: Normal range of motion.  Neurological: She is alert and oriented to person, place, and time.  Skin: Skin is warm and dry. She is not diaphoretic.    ED Course  Procedures (including critical care time)  Labs Reviewed - No data to display No results found.  No diagnosis found.   Date: 04/18/2012  Rate: 86  Rhythm: normal sinus rhythm  QRS Axis: normal  Intervals: normal  ST/T Wave abnormalities: normal  Conduction Disutrbances:none  Narrative Interpretation:   Old EKG Reviewed: none available    MDM  The patient presents with palpitations that resolved prior to arrival to the ed.  She was observed and further episodes did not occur.  The labs, including troponin are all okay.  She will be discharged to home with the instructions to follow up with her primary care doctor to discuss holter monitoring if future episodes occur.        Geoffery Lyons, MD 04/18/12 2126

## 2012-04-18 NOTE — ED Notes (Signed)
Pt reports sudden onset of heart racing after eating dinner- c/o mild mid chest pain at present- HR 89 in triage

## 2012-04-18 NOTE — ED Notes (Signed)
Pt reports she was eating dinner and her heart started racing, she went to lay down and it got better, this occurred about 1 month ago when she started taking lisinopril for htn, she was on it for 5 days and pcp discontinued medication. Denies sob, chest pain,

## 2013-07-17 ENCOUNTER — Encounter: Payer: Self-pay | Admitting: Obstetrics and Gynecology

## 2013-07-17 ENCOUNTER — Ambulatory Visit (INDEPENDENT_AMBULATORY_CARE_PROVIDER_SITE_OTHER): Payer: BC Managed Care – PPO | Admitting: Obstetrics and Gynecology

## 2013-07-17 VITALS — BP 158/80 | HR 86 | Resp 18 | Ht 65.5 in | Wt 131.0 lb

## 2013-07-17 DIAGNOSIS — Z0000 Encounter for general adult medical examination without abnormal findings: Secondary | ICD-10-CM

## 2013-07-17 DIAGNOSIS — Z01419 Encounter for gynecological examination (general) (routine) without abnormal findings: Secondary | ICD-10-CM

## 2013-07-17 LAB — POCT URINALYSIS DIPSTICK
Bilirubin, UA: NEGATIVE
Glucose, UA: NEGATIVE
Ketones, UA: NEGATIVE
Leukocytes, UA: NEGATIVE

## 2013-07-17 LAB — HEMOGLOBIN, FINGERSTICK: Hemoglobin, fingerstick: 15.7 g/dL (ref 12.0–16.0)

## 2013-07-17 NOTE — Patient Instructions (Signed)

## 2013-07-17 NOTE — Progress Notes (Signed)
GYNECOLOGY VISIT  PCP: Dr. Viviann Spare  Referring provider: previous pt of Dr. Edward Jolly  HPI: 57 y.o.   Married  Caucasian  female   G0P0000 with Patient's last menstrual period was 08/29/2008.   here for Annual Exam   Has manageable hot flashes.  Denies vaginal dryness.   Has stopped Bistolic on her own  4- 5 months ago due to side effects.  Has not seen PCP since doing this.  Is taking her blood pressure at home.  Hgb:  15.7 Urine: neg   GYNECOLOGIC HISTORY: Patient's last menstrual period was 08/29/2008. Sexually active:  yes Partner preference: female Contraception:   menopausal Menopausal hormone therapy: none DES exposure:   no Blood transfusions:  no  Sexually transmitted diseases:  no  GYN Procedures:  no Mammogram: 2012 normal                Pap:   2012 neg History of abnormal pap smear:  no   OB History   Grav Para Term Preterm Abortions TAB SAB Ect Mult Living   0 0 0 0 0 0 0 0 0 0        LIFESTYLE: Exercise:   Walking, dancing 3-5 days a week            Tobacco: no Alcohol: 2-3 times a year a glass of wine Drug use:  no  OTHER HEALTH MAINTENANCE: Tetanus/TDap: 2006 Gardisil: no Influenza:  no Zostavax: no  Bone density: never Colonoscopy: 2010 (1) polyp  Cholesterol check: 2012 normal.    Family History  Problem Relation Age of Onset  . Cancer Mother     bladder cancer  . Cancer Father     colon cancer    Patient Active Problem List   Diagnosis Date Noted  . TACHYCARDIA 06/22/2010  . HYPERTENSION 12/14/2006  . ECHOCARDIOGRAM, ABNORMAL 12/14/2006   Past Medical History  Diagnosis Date  . Hypertension     History reviewed. No pertinent past surgical history.  ALLERGIES: Amoxicillin-pot clavulanate and Augmentin  Current Outpatient Prescriptions  Medication Sig Dispense Refill  . Ascorbic Acid (VITAMIN C PO) Take by mouth daily.      . milk thistle 175 MG tablet Take 175 mg by mouth daily.      Bertram Gala Glycol-Propyl Glycol (SYSTANE  OP) Apply 1 drop to eye daily.       No current facility-administered medications for this visit.     ROS:  Pertinent items are noted in HPI.  SOCIAL HISTORY:  Patient and husband are retired.  Are now enjoying dancing and traveling for this purpose.   PHYSICAL EXAMINATION:    BP 158/80  Pulse 86  Resp 18  Ht 5' 5.5" (1.664 m)  Wt 131 lb (59.421 kg)  BMI 21.46 kg/m2  LMP 08/29/2008   Wt Readings from Last 3 Encounters:  07/17/13 131 lb (59.421 kg)  04/18/12 122 lb (55.339 kg)  08/06/10 130 lb (58.968 kg)     Ht Readings from Last 3 Encounters:  07/17/13 5' 5.5" (1.664 m)  04/18/12 5\' 5"  (1.651 m)  08/06/10 5\' 6"  (1.676 m)    General appearance: alert, cooperative and appears stated age Head: Normocephalic, without obvious abnormality, atraumatic Neck: no adenopathy, supple, symmetrical, trachea midline and thyroid not enlarged, symmetric, no tenderness/mass/nodules Lungs: clear to auscultation bilaterally Breasts: Inspection negative, No nipple retraction or dimpling, No nipple discharge or bleeding, No axillary or supraclavicular adenopathy, Normal to palpation without dominant masses Heart: regular rhythm.  Tachy at about 100  bpm. Abdomen: soft, non-tender; no masses,  no organomegaly Extremities: extremities normal, atraumatic, no cyanosis or edema Skin: Skin color, texture, turgor normal. No rashes.  Mid back with 1.0 cm probable lipoma palpable. Lymph nodes: Cervical, supraclavicular, and axillary nodes normal. No abnormal inguinal nodes palpated Neurologic: Grossly normal  Pelvic: External genitalia:  no lesions              Urethra:  normal appearing urethra with no masses, tenderness or lesions              Bartholins and Skenes: normal                 Vagina: normal appearing vagina with normal color and discharge, atrophy noted.               Cervix: normal appearance              Pap and high risk HPV testing done: yes.            Bimanual Exam:  Uterus:   uterus is normal size, shape, consistency and nontender                                      Adnexa: normal adnexa in size, nontender and no masses                                      Rectovaginal: Confirms                                      Anus:  normal sphincter tone, no lesions  ASSESSMENT  Normal gynecologic exam. Mildly elevated blood pressure.  Off antiHTN. Atrophic vaginal changes noted - asymptomatic.   PLAN  Mammogram at [facility].  Pap smear and high risk HPV testing Counseled on Calcium and Vitamin D.  Fasting blood work next year.  Patient is monitoring blood pressure at home. I suggested she follow up with her PCP and present BP recordings to her.  Return annually or prn   An After Visit Summary was printed and given to the patient.

## 2013-07-18 ENCOUNTER — Other Ambulatory Visit: Payer: Self-pay | Admitting: Obstetrics and Gynecology

## 2013-07-18 DIAGNOSIS — Z1231 Encounter for screening mammogram for malignant neoplasm of breast: Secondary | ICD-10-CM

## 2013-07-18 NOTE — Addendum Note (Signed)
Addended by: Conley Simmonds on: 07/18/2013 05:16 PM   Modules accepted: Orders

## 2013-07-23 LAB — IPS PAP TEST WITH HPV

## 2013-08-27 ENCOUNTER — Ambulatory Visit
Admission: RE | Admit: 2013-08-27 | Discharge: 2013-08-27 | Disposition: A | Discharge status: Home / Self Care | Payer: BC Managed Care – PPO | Source: Ambulatory Visit | Attending: Obstetrics and Gynecology | Admitting: Obstetrics and Gynecology

## 2013-08-27 DIAGNOSIS — Z1231 Encounter for screening mammogram for malignant neoplasm of breast: Secondary | ICD-10-CM

## 2014-05-07 ENCOUNTER — Encounter: Payer: Self-pay | Admitting: Obstetrics and Gynecology

## 2014-07-30 ENCOUNTER — Ambulatory Visit: Payer: BC Managed Care – PPO | Admitting: Obstetrics and Gynecology

## 2014-11-26 ENCOUNTER — Encounter: Payer: Self-pay | Admitting: *Deleted

## 2014-11-26 ENCOUNTER — Emergency Department
Admission: EM | Admit: 2014-11-26 | Discharge: 2014-11-26 | Disposition: A | Discharge status: Home / Self Care | Payer: BLUE CROSS/BLUE SHIELD | Source: Home / Self Care | Attending: Family Medicine | Admitting: Family Medicine

## 2014-11-26 DIAGNOSIS — J320 Chronic maxillary sinusitis: Secondary | ICD-10-CM

## 2014-11-26 MED ORDER — AMOXICILLIN 875 MG PO TABS: 875.0000 mg | ORAL_TABLET | Freq: Two times a day (BID) | ORAL | Status: DC

## 2014-11-26 NOTE — ED Notes (Signed)
Pt c/o nasal congestion and sinus pain x 4 days, after having a URI on and off x 2 wks. Denies fever.

## 2014-11-26 NOTE — Discharge Instructions (Signed)
Take plain guaifenesin (1200mg  extended release tabs such as Mucinex) twice daily, with plenty of water, for congestion. Get adequate rest.   May use Afrin nasal spray (or generic oxymetazoline) twice daily for about 5 days.  Also recommend using saline nasal spray several times daily and saline nasal irrigation (AYR is a common brand).   Try warm salt water gargles for sore throat.  Stop all antihistamines for now, and other non-prescription cough/cold preparations.   Follow-up with family doctor if not improving about10 days.    Sinusitis Sinusitis is redness, soreness, and inflammation of the paranasal sinuses. Paranasal sinuses are air pockets within the bones of your face (beneath the eyes, the middle of the forehead, or above the eyes). In healthy paranasal sinuses, mucus is able to drain out, and air is able to circulate through them by way of your nose. However, when your paranasal sinuses are inflamed, mucus and air can become trapped. This can allow bacteria and other germs to grow and cause infection. Sinusitis can develop quickly and last only a short time (acute) or continue over a long period (chronic). Sinusitis that lasts for more than 12 weeks is considered chronic.  CAUSES  Causes of sinusitis include:  Allergies.  Structural abnormalities, such as displacement of the cartilage that separates your nostrils (deviated septum), which can decrease the air flow through your nose and sinuses and affect sinus drainage.  Functional abnormalities, such as when the small hairs (cilia) that line your sinuses and help remove mucus do not work properly or are not present. SIGNS AND SYMPTOMS  Symptoms of acute and chronic sinusitis are the same. The primary symptoms are pain and pressure around the affected sinuses. Other symptoms include:  Upper toothache.  Earache.  Headache.  Bad breath.  Decreased sense of smell and taste.  A cough, which worsens when you are lying  flat.  Fatigue.  Fever.  Thick drainage from your nose, which often is green and may contain pus (purulent).  Swelling and warmth over the affected sinuses. DIAGNOSIS  Your health care provider will perform a physical exam. During the exam, your health care provider may:  Look in your nose for signs of abnormal growths in your nostrils (nasal polyps).  Tap over the affected sinus to check for signs of infection.  View the inside of your sinuses (endoscopy) using an imaging device that has a light attached (endoscope). If your health care provider suspects that you have chronic sinusitis, one or more of the following tests may be recommended:  Allergy tests.  Nasal culture. A sample of mucus is taken from your nose, sent to a lab, and screened for bacteria.  Nasal cytology. A sample of mucus is taken from your nose and examined by your health care provider to determine if your sinusitis is related to an allergy. TREATMENT  Most cases of acute sinusitis are related to a viral infection and will resolve on their own within 10 days. Sometimes medicines are prescribed to help relieve symptoms (pain medicine, decongestants, nasal steroid sprays, or saline sprays).  However, for sinusitis related to a bacterial infection, your health care provider will prescribe antibiotic medicines. These are medicines that will help kill the bacteria causing the infection.  Rarely, sinusitis is caused by a fungal infection. In theses cases, your health care provider will prescribe antifungal medicine. For some cases of chronic sinusitis, surgery is needed. Generally, these are cases in which sinusitis recurs more than 3 times per year, despite other treatments.  HOME CARE INSTRUCTIONS   Drink plenty of water. Water helps thin the mucus so your sinuses can drain more easily.  Use a humidifier.  Inhale steam 3 to 4 times a day (for example, sit in the bathroom with the shower running).  Apply a warm,  moist washcloth to your face 3 to 4 times a day, or as directed by your health care provider.  Use saline nasal sprays to help moisten and clean your sinuses.  Take medicines only as directed by your health care provider.  If you were prescribed either an antibiotic or antifungal medicine, finish it all even if you start to feel better. SEEK IMMEDIATE MEDICAL CARE IF:  You have increasing pain or severe headaches.  You have nausea, vomiting, or drowsiness.  You have swelling around your face.  You have vision problems.  You have a stiff neck.  You have difficulty breathing. MAKE SURE YOU:   Understand these instructions.  Will watch your condition.  Will get help right away if you are not doing well or get worse. Document Released: 08/15/2005 Document Revised: 12/30/2013 Document Reviewed: 08/30/2011 [facility] Patient Information 2015 [place], [place]. This information is not intended to replace advice given to you by your health care provider. Make sure you discuss any questions you have with your health care provider.

## 2014-11-26 NOTE — ED Provider Notes (Signed)
CSN: [id number]     Arrival date & time 11/26/14  0944 History   First MD Initiated Contact with Patient 11/26/14 1010     Chief Complaint  Patient presents with  . Nasal Congestion      HPI Comments: Patient states that she had a URI about two weeks ago with sore throat, cough, headache, and sinus congestion.  Four days ago she developed increased congestion and left sinus pain.  No fevers, chills, and sweats   The history is provided by the patient.    Past Medical History  Diagnosis Date  . Hypertension    History reviewed. No pertinent past surgical history. Family History  Problem Relation Age of Onset  . Cancer Mother     bladder cancer  . Cancer Father     colon cancer   History  Substance Use Topics  . Smoking status: Never Smoker   . Smokeless tobacco: Never Used  . Alcohol Use: 0.5 oz/week    1 drink(s) per week     Comment: 2-3 times a year  a glass of wine   OB History    Gravida Para Term Preterm AB TAB SAB Ectopic Multiple Living   0 0 0 0 0 0 0 0 0 0      Review of Systems + sore throat + cough No pleuritic pain No wheezing + nasal congestion + post-nasal drainage + sinus pain/pressure No itchy/red eyes No earache No hemoptysis No SOB No fever/chills No nausea No vomiting No abdominal pain No diarrhea No urinary symptoms No skin rash + fatigue No myalgias + headache Used OTC meds without relief  Allergies  Amoxicillin-pot clavulanate and Augmentin  Home Medications   Prior to Admission medications   Medication Sig Start Date End Date Taking? Authorizing Provider  amoxicillin (AMOXIL) 875 MG tablet Take 1 tablet (875 mg total) by mouth 2 (two) times daily. 11/26/14   Kandra Nicolas, MD  Ascorbic Acid (VITAMIN C PO) Take by mouth daily.    Historical Provider, MD  milk thistle 175 MG tablet Take 175 mg by mouth daily.    Historical Provider, MD  Polyethyl Glycol-Propyl Glycol (SYSTANE OP) Apply 1 drop to eye daily.    Historical  Provider, MD   BP 151/83 mmHg  Pulse 120  Temp(Src) 97.8 F (36.6 C) (Oral)  Resp 16  Ht 5\' 5"  (1.651 m)  Wt 131 lb (59.421 kg)  BMI 21.80 kg/m2  SpO2 100%  LMP 08/29/2008 Physical Exam Nursing notes and Vital Signs reviewed. Appearance:  Patient appears stated age, and in no acute distress Eyes:  Pupils are equal, round, and reactive to light and accomodation.  Extraocular movement is intact.  Conjunctivae are not inflamed  Ears:  Canals normal.  Tympanic membranes normal.  Nose:  Congested turbinates.  Left maxillary sinus tenderness is present.  Pharynx:  Normal Neck:  Supple.   Tender enlarged left posterior nodes palpated. Lungs:  Clear to auscultation.  Breath sounds are equal.  Heart:  Regular rate and rhythm without murmurs, rubs, or gallops.  Abdomen:  Nontender without masses or hepatosplenomegaly.  Bowel sounds are present.  No CVA or flank tenderness.  Extremities:  No edema.  No calf tenderness Skin:  No rash present.   ED Course  Procedures  none   MDM   1. Left maxillary sinusitis    Begin amoxicillin 875mg  BID for 10 days (patient has had no adverse effects from amoxicillin) Take plain guaifenesin (1200mg  extended release tabs such  as Mucinex) twice daily, with plenty of water, for congestion. Get adequate rest.   May use Afrin nasal spray (or generic oxymetazoline) twice daily for about 5 days.  Also recommend using saline nasal spray several times daily and saline nasal irrigation (AYR is a common brand).   Try warm salt water gargles for sore throat.  Stop all antihistamines for now, and other non-prescription cough/cold preparations.   Follow-up with family doctor if not improving about 10 days.    Kandra Nicolas, MD 11/29/14 [phone]

## 2015-01-01 ENCOUNTER — Ambulatory Visit (INDEPENDENT_AMBULATORY_CARE_PROVIDER_SITE_OTHER): Payer: BLUE CROSS/BLUE SHIELD | Admitting: Obstetrics and Gynecology

## 2015-01-01 ENCOUNTER — Encounter: Payer: Self-pay | Admitting: Obstetrics and Gynecology

## 2015-01-01 VITALS — BP 138/70 | HR 130 | Resp 14 | Ht 66.0 in | Wt 129.6 lb

## 2015-01-01 DIAGNOSIS — Z0000 Encounter for general adult medical examination without abnormal findings: Secondary | ICD-10-CM

## 2015-01-01 DIAGNOSIS — Z01419 Encounter for gynecological examination (general) (routine) without abnormal findings: Secondary | ICD-10-CM | POA: Diagnosis not present

## 2015-01-01 LAB — POCT URINALYSIS DIPSTICK
BILIRUBIN UA: NEGATIVE
GLUCOSE UA: NEGATIVE
Leukocytes, UA: NEGATIVE
NITRITE UA: NEGATIVE
PH UA: 5
Protein, UA: NEGATIVE
RBC UA: NEGATIVE
UROBILINOGEN UA: NEGATIVE

## 2015-01-01 NOTE — Patient Instructions (Signed)

## 2015-01-01 NOTE — Progress Notes (Addendum)
Patient ID: Melissa Russell, female   DOB: 10-Apr-1956, 59 y.o.   MRN: [id number] 59 y.o. [place] Married Caucasian female here for annual exam.    Having sinus problems.  Seen at urgent care and received abx.  PCP:  Veneda Melter   Patient's last menstrual period was 08/29/2008.          Sexually active: Yes.   female partne The current method of family planning is post menopausal status.    Exercising: Yes,  walking and dancing. Smoker:  no  Health Maintenance: Pap:  07-18-13 wnl:neg HR HPV History of abnormal Pap:  no MMG:  08-27-13 extremely dense/nl:[facility]. Colonoscopy:  12-02-09 hyperplastic polyp with Dr. Carlean Purl.  Next due 2016 due to family history--father. BMD:   n/a  Result  n/a TDaP:  Td in 01-02-06 Screening Labs:  Hb today: PCP, Urine today: 1+Ketones - has not eaten much today.    reports that she has never smoked. She has never used smokeless tobacco. She reports that she drinks alcohol. She reports that she does not use illicit drugs.  Past Medical History  Diagnosis Date  . Hypertension     History reviewed. No pertinent past surgical history.  Current Outpatient Prescriptions  Medication Sig Dispense Refill  . Ascorbic Acid (VITAMIN C PO) Take by mouth daily.    Vladimir Faster Glycol-Propyl Glycol (SYSTANE OP) Apply 1 drop to eye daily.     No current facility-administered medications for this visit.    Family History  Problem Relation Age of Onset  . Cancer Mother     bladder cancer  . Cancer Father     colon cancer    ROS:  Pertinent items are noted in HPI.  Otherwise, a comprehensive ROS was negative.  Exam:   BP 138/70 mmHg  Pulse 130  Resp 14  Ht 5\' 6"  (1.676 m)  Wt 129 lb 9.6 oz (58.786 kg)  BMI 20.93 kg/m2  LMP 08/29/2008    General appearance: alert, cooperative and appears stated age Head: Normocephalic, without obvious abnormality, atraumatic Neck: no adenopathy, supple, symmetrical, trachea midline and thyroid normal to  inspection and palpation Lungs: clear to auscultation bilaterally Breasts: normal appearance, no masses or tenderness, Inspection negative, No nipple retraction or dimpling, No nipple discharge or bleeding, No axillary or supraclavicular adenopathy Right breast with 4 mm sebaceous cyst at 8:00.  Heart: regular rate and rhythm Abdomen: soft, non-tender; bowel sounds normal; no masses,  no organomegaly Extremities: extremities normal, atraumatic, no cyanosis or edema Skin: Skin color, texture, turgor normal. No rashes or lesions Lymph nodes: Cervical, supraclavicular, and axillary nodes normal. No abnormal inguinal nodes palpated Neurologic: Grossly normal  Pelvic: External genitalia:  no lesions              Urethra:  normal appearing urethra with no masses, tenderness or lesions              Bartholins and Skenes: normal                 Vagina: normal appearing vagina with normal color and discharge, no lesions              Cervix: no lesions              Pap taken: No. Bimanual Exam:  Uterus:  normal size, contour, position, consistency, mobility, non-tender              Adnexa: normal adnexa and no mass, fullness, tenderness  Rectovaginal: Yes.  .  Confirms.              Anus:  normal sphincter tone, no lesions  Chaperone was present for exam.  Assessment:   Well woman visit with normal exam. Chronic sinusitis.   Plan: Yearly mammogram recommended after age 9.   Patient will schedule mammogram.  Recommended self breast exam.  Pap and HR HPV as above. Discussed Calcium, Vitamin D, regular exercise program including cardiovascular and weight bearing exercise. Labs performed.  No..   See orders. Refills given on medications.  No..  See orders. Colonoscopy due.  Patient will call to schedule. Gave patient name of [facility] for consultation regarding sinusitis.  She may seek referral through her PCP to this office if needed. Follow up annually and prn.       After visit summary provided.

## 2015-01-05 ENCOUNTER — Encounter: Payer: Self-pay | Admitting: Internal Medicine

## 2015-04-03 ENCOUNTER — Emergency Department (INDEPENDENT_AMBULATORY_CARE_PROVIDER_SITE_OTHER): Payer: BLUE CROSS/BLUE SHIELD

## 2015-04-03 ENCOUNTER — Encounter: Payer: Self-pay | Admitting: Emergency Medicine

## 2015-04-03 ENCOUNTER — Emergency Department
Admission: EM | Admit: 2015-04-03 | Discharge: 2015-04-03 | Disposition: A | Discharge status: Home / Self Care | Payer: BLUE CROSS/BLUE SHIELD | Source: Home / Self Care | Attending: Family Medicine | Admitting: Family Medicine

## 2015-04-03 DIAGNOSIS — R51 Headache: Secondary | ICD-10-CM

## 2015-04-03 DIAGNOSIS — J329 Chronic sinusitis, unspecified: Secondary | ICD-10-CM

## 2015-04-03 DIAGNOSIS — G500 Trigeminal neuralgia: Secondary | ICD-10-CM | POA: Diagnosis not present

## 2015-04-03 MED ORDER — MOMETASONE FUROATE 50 MCG/ACT NA SUSP: NASAL | Status: DC

## 2015-04-03 MED ORDER — PREDNISONE 20 MG PO TABS: ORAL_TABLET | ORAL | Status: DC

## 2015-04-03 MED ORDER — MOXIFLOXACIN HCL 400 MG PO TABS: ORAL_TABLET | ORAL | Status: DC

## 2015-04-03 NOTE — ED Provider Notes (Signed)
CSN: [id number]     Arrival date & time 04/03/15  1438 History   First MD Initiated Contact with Patient 04/03/15 1554     Chief Complaint  Patient presents with  . Facial Pain      HPI Comments: The patient had a viral URI about four months ago and subsequently was treated for sinusitis with amoxicillin.  She improved somewhat, but continued to have post-nasal drainage and has had persistent bilateral facial and forehead pain and fullness.  She occasionally has pressure sensation behind the bridge of her nose.  No fevers, chills, and sweats   The history is provided by the patient.    Past Medical History  Diagnosis Date  . Hypertension    History reviewed. No pertinent past surgical history. Family History  Problem Relation Age of Onset  . Cancer Mother     bladder cancer  . Cancer Father     colon cancer   History  Substance Use Topics  . Smoking status: Never Smoker   . Smokeless tobacco: Never Used  . Alcohol Use: 0.0 oz/week    0 Standard drinks or equivalent per week   OB History    Gravida Para Term Preterm AB TAB SAB Ectopic Multiple Living   0 0 0 0 0 0 0 0 0 0      Review of Systems No sore throat No cough No pleuritic pain No wheezing + nasal congestion + post-nasal drainage + sinus pain/pressure No itchy/red eyes No earache No hemoptysis No SOB No fever/chills No nausea No vomiting No abdominal pain No diarrhea No urinary symptoms No skin rash No fatigue No myalgias No headache Used OTC meds without relief  Allergies  Amoxicillin-pot clavulanate and Augmentin  Home Medications   Prior to Admission medications   Medication Sig Start Date End Date Taking? Authorizing Provider  Ascorbic Acid (VITAMIN C PO) Take by mouth daily.    Historical Provider, MD  mometasone (NASONEX) 50 MCG/ACT nasal spray Place 2 sprays in each side of nose once daily 04/03/15   Kandra Nicolas, MD  moxifloxacin (AVELOX) 400 MG tablet Take one tab by mouth once daily  04/03/15   Kandra Nicolas, MD  Polyethyl Glycol-Propyl Glycol (SYSTANE OP) Apply 1 drop to eye daily.    Historical Provider, MD  predniSONE (DELTASONE) 20 MG tablet Take one tab by mouth twice daily for five days, then decrease to one daily for five days. Take with food. 04/03/15   Kandra Nicolas, MD   BP 153/89 mmHg  Pulse 110  Temp(Src) 98.3 F (36.8 C) (Oral)  Resp 16  SpO2 98%  LMP 08/29/2008 Physical Exam Nursing notes and Vital Signs reviewed. Appearance:  Patient appears stated age, and in no acute distress Eyes:  Pupils are equal, round, and reactive to light and accomodation.  Extraocular movement is intact.  Conjunctivae are not inflamed  Ears:  Canals normal.  Tympanic membranes normal.  Nose:  Congested turbinates.  Bilateral frontal and maxillary sinus tenderness is present.  Pharynx:  Normal Neck:  Supple.  No adenopathy Lungs:  Clear to auscultation.  Breath sounds are equal.  Moving air well. Heart:  Regular rate and rhythm without murmurs, rubs, or gallops.  Abdomen:  Nontender without masses or hepatosplenomegaly.  Bowel sounds are present.  No CVA or flank tenderness.  Extremities:  No edema.  No calf tenderness Skin:  No rash present.   ED Course  Procedures  none  Imaging Review Dg Sinuses Complete  04/03/2015   CLINICAL DATA:  Left-sided facial pain and pressure for 4 months with drainage and headaches  EXAM: PARANASAL SINUSES - COMPLETE 3 + VIEW  COMPARISON:  None.  FINDINGS: Water's, lateral, Towne's, and submentovertex images obtained. There is diffuse opacification of the left maxillary antrum. There is diffuse opacification of most of the left frontal sinus in the medial aspect the right frontal sinus. The mucosal thickening is also noted in several ethmoid air cells on the left. There is no appreciable air-fluid level. No bony destruction or expansion. Mastoids appear clear. There is rightward deviation of the nasal septum.  IMPRESSION: Extensive opacification  of the left maxillary antrum as well as much of the left frontal sinus and the medial right frontal sinus. There is patchy sinus disease in the left ethmoid air cell complex. No bony destruction or expansion. No air-fluid levels. Rightward deviation of the nasal septum is noted.   Electronically Signed   By: Lowella Grip III M.D.   On: 04/03/2015 16:30     MDM   1. Chronic sinusitis, unspecified location    Begin prednisone burst.  Begin moxifloxacin 400mg  daily for 10 days.  Begin Nasonex nasal spray.  Patient states that she is unable to tolerated nasal irrigation. Followup with ENT in about 10 days.    Kandra Nicolas, MD 04/05/15 [phone]

## 2015-04-03 NOTE — ED Notes (Signed)
Pt c/o sinus drainage and HA x 4 months. States she took abx for this a couple months ago and denies any improvement.

## 2015-04-03 NOTE — Discharge Instructions (Signed)

## 2015-04-07 ENCOUNTER — Telehealth: Payer: Self-pay | Admitting: *Deleted

## 2015-04-13 ENCOUNTER — Encounter: Payer: Self-pay | Admitting: Internal Medicine

## 2015-04-24 ENCOUNTER — Other Ambulatory Visit (INDEPENDENT_AMBULATORY_CARE_PROVIDER_SITE_OTHER): Payer: Self-pay | Admitting: Otolaryngology

## 2015-04-24 DIAGNOSIS — J0101 Acute recurrent maxillary sinusitis: Secondary | ICD-10-CM

## 2015-04-29 ENCOUNTER — Ambulatory Visit
Admission: RE | Admit: 2015-04-29 | Discharge: 2015-04-29 | Disposition: A | Discharge status: Home / Self Care | Payer: BLUE CROSS/BLUE SHIELD | Source: Ambulatory Visit | Attending: Otolaryngology | Admitting: Otolaryngology

## 2015-04-29 DIAGNOSIS — J0101 Acute recurrent maxillary sinusitis: Secondary | ICD-10-CM

## 2015-04-30 ENCOUNTER — Ambulatory Visit (INDEPENDENT_AMBULATORY_CARE_PROVIDER_SITE_OTHER): Payer: BLUE CROSS/BLUE SHIELD | Admitting: Otolaryngology

## 2015-04-30 DIAGNOSIS — J0101 Acute recurrent maxillary sinusitis: Secondary | ICD-10-CM

## 2015-04-30 DIAGNOSIS — J310 Chronic rhinitis: Secondary | ICD-10-CM | POA: Diagnosis not present

## 2015-05-13 ENCOUNTER — Ambulatory Visit (INDEPENDENT_AMBULATORY_CARE_PROVIDER_SITE_OTHER): Payer: BLUE CROSS/BLUE SHIELD

## 2015-05-13 ENCOUNTER — Other Ambulatory Visit: Payer: Self-pay | Admitting: Ophthalmology

## 2015-05-13 DIAGNOSIS — D869 Sarcoidosis, unspecified: Secondary | ICD-10-CM

## 2015-05-14 ENCOUNTER — Encounter: Payer: Self-pay | Admitting: Internal Medicine

## 2015-05-21 ENCOUNTER — Ambulatory Visit (INDEPENDENT_AMBULATORY_CARE_PROVIDER_SITE_OTHER): Payer: BLUE CROSS/BLUE SHIELD | Admitting: Otolaryngology

## 2015-05-21 DIAGNOSIS — J320 Chronic maxillary sinusitis: Secondary | ICD-10-CM

## 2015-05-21 DIAGNOSIS — J342 Deviated nasal septum: Secondary | ICD-10-CM

## 2015-07-07 ENCOUNTER — Other Ambulatory Visit: Payer: Self-pay

## 2015-07-07 DIAGNOSIS — Z1231 Encounter for screening mammogram for malignant neoplasm of breast: Secondary | ICD-10-CM

## 2015-07-28 ENCOUNTER — Ambulatory Visit
Admission: RE | Admit: 2015-07-28 | Discharge: 2015-07-28 | Disposition: A | Discharge status: Home / Self Care | Payer: BLUE CROSS/BLUE SHIELD | Source: Ambulatory Visit

## 2015-07-28 DIAGNOSIS — Z1231 Encounter for screening mammogram for malignant neoplasm of breast: Secondary | ICD-10-CM

## 2016-01-27 ENCOUNTER — Encounter: Payer: Self-pay | Admitting: Obstetrics and Gynecology

## 2016-01-27 ENCOUNTER — Ambulatory Visit (INDEPENDENT_AMBULATORY_CARE_PROVIDER_SITE_OTHER): Payer: BLUE CROSS/BLUE SHIELD | Admitting: Obstetrics and Gynecology

## 2016-01-27 VITALS — BP 122/72 | HR 80 | Resp 20 | Ht 65.75 in | Wt 126.0 lb

## 2016-01-27 DIAGNOSIS — Z01419 Encounter for gynecological examination (general) (routine) without abnormal findings: Secondary | ICD-10-CM

## 2016-01-27 DIAGNOSIS — Z1151 Encounter for screening for human papillomavirus (HPV): Secondary | ICD-10-CM | POA: Diagnosis not present

## 2016-01-27 DIAGNOSIS — Z0000 Encounter for general adult medical examination without abnormal findings: Secondary | ICD-10-CM

## 2016-01-27 LAB — POCT URINALYSIS DIPSTICK
BILIRUBIN UA: NEGATIVE
Blood, UA: NEGATIVE
Glucose, UA: NEGATIVE
KETONES UA: NEGATIVE
LEUKOCYTES UA: NEGATIVE
Nitrite, UA: NEGATIVE
PROTEIN UA: NEGATIVE
Urobilinogen, UA: NEGATIVE
pH, UA: 5

## 2016-01-27 NOTE — Progress Notes (Signed)
60 y.o. G76P0000 Married Caucasian female here for annual exam.    No bleeding or spotting.   Has family in [place].   Has sinusitis and fluid around her eyes. Having dental issues that may be causing a sinus problem for her.  Has seen a retinal specialist about fluid around her eyes. Thinks she had her thyroid checked.   PCP:   Dr. Mellody Drown - [facility] in [place]  Patient's last menstrual period was 08/29/2008.         Sexually active: No.  The current method of family planning is post menopausal status.    Exercising: Yes.    walking & dancing.  She and her husband love to square dance.  Smoker:  no  Health Maintenance: Pap:  07-18-13 neg HPV HR neg History of abnormal Pap:  no MMG:  07-28-15 category d density birads 1:neg Colonoscopy:  12-02-09 hyperplastic polyp f/u 2016, family hx.  She will see Dr. Carlean Purl this year. BMD:   n/a  Result  N/a TDaP:  2007 or 2008.  Dr. Larose Kells. Patient will check the date of the last vaccine.  Gardasil:  no HIV: HIV neg Hep C: HIV neg Screening Labs:  Hb today: pcp, Urine today: neg   reports that she has never smoked. She has never used smokeless tobacco. She reports that she does not drink alcohol or use illicit drugs.  Past Medical History  Diagnosis Date  . Hypertension     History reviewed. No pertinent past surgical history.  Current Outpatient Prescriptions  Medication Sig Dispense Refill  . Polyethyl Glycol-Propyl Glycol (SYSTANE OP) Apply 1 drop to eye daily.    . Probiotic Product (PROBIOTIC PO) Take by mouth. Takes 2 daily    . sulindac (CLINORIL) 200 MG tablet Take by mouth. Take 1/2 tablet qod     No current facility-administered medications for this visit.    Family History  Problem Relation Age of Onset  . Cancer Mother     bladder cancer  . Cancer Father     colon cancer    ROS:  Pertinent items are noted in HPI.  Otherwise, a comprehensive ROS was negative.  Exam:   BP 122/72 mmHg  Pulse 80   Resp 20  Ht 5' 5.75" (1.67 m)  Wt 126 lb (57.153 kg)  BMI 20.49 kg/m2  LMP 08/29/2008    General appearance: alert, cooperative and appears stated age Head: Normocephalic, without obvious abnormality, atraumatic Neck: no adenopathy, supple, symmetrical, trachea midline and thyroid normal to inspection and palpation Lungs: clear to auscultation bilaterally Breasts: normal appearance, no masses or tenderness, No nipple retraction or dimpling, No nipple discharge or bleeding, No axillary or supraclavicular adenopathy, right breast with 1 cm sebaceous cyst at 8:00. Heart: regular rate and rhythm Abdomen: incisions:  No.    , soft, non-tender; no masses, no organomegaly Extremities: extremities normal, atraumatic, no cyanosis or edema Skin: Skin color, texture, turgor normal. No rashes or lesions Lymph nodes: Cervical, supraclavicular, and axillary nodes normal. No abnormal inguinal nodes palpated Neurologic: Grossly normal  Pelvic: External genitalia:  no lesions              Urethra:  normal appearing urethra with no masses, tenderness or lesions              Bartholins and Skenes: normal                 Vagina: normal appearing vagina with normal color and discharge, no lesions  Cervix: no lesions              Pap taken: Yes.   Bimanual Exam:  Uterus:  normal size, contour, position, consistency, mobility, non-tender              Adnexa: normal adnexa and no mass, fullness, tenderness              Rectal exam: Yes.  .  Confirms.              Anus:  normal sphincter tone, no lesions  Chaperone was present for exam.  Assessment:   Well woman visit with normal exam. Right breast sebaceous cyst.  Sinusitis.  Dental problems. Retinal issues.   Plan: Yearly mammogram recommended after age 19.  Recommended self breast exam.  Pap and HR HPV as above. Recommendations for Calcium, Vitamin D, regular exercise program including cardiovascular and weight bearing exercise. Labs  performed.  No..    Prescription medication(s) given.  No..    Follow up annually and prn.       After visit summary provided.

## 2016-01-27 NOTE — Patient Instructions (Signed)
Health Maintenance, Female Adopting a healthy lifestyle and getting preventive care can go a long way to promote health and wellness. Talk with your health care provider about what schedule of regular examinations is right for you. This is a good chance for you to check in with your provider about disease prevention and staying healthy. In between checkups, there are plenty of things you can do on your own. Experts have done a lot of research about which lifestyle changes and preventive measures are most likely to keep you healthy. Ask your health care provider for more information. WEIGHT AND DIET  Eat a healthy diet  Be sure to include plenty of vegetables, fruits, low-fat dairy products, and lean protein.  Do not eat a lot of foods high in solid fats, added sugars, or salt.  Get regular exercise. This is one of the most important things you can do for your health.  Most adults should exercise for at least 150 minutes each week. The exercise should increase your heart rate and make you sweat (moderate-intensity exercise).  Most adults should also do strengthening exercises at least twice a week. This is in addition to the moderate-intensity exercise.  Maintain a healthy weight  Body mass index (BMI) is a measurement that can be used to identify possible weight problems. It estimates body fat based on height and weight. Your health care provider can help determine your BMI and help you achieve or maintain a healthy weight.  For females 20 years of age and older:   A BMI below 18.5 is considered underweight.  A BMI of 18.5 to 24.9 is normal.  A BMI of 25 to 29.9 is considered overweight.  A BMI of 30 and above is considered obese.  Watch levels of cholesterol and blood lipids  You should start having your blood tested for lipids and cholesterol at 62 years of age, then have this test every 5 years.  You may need to have your cholesterol levels checked more often if:  Your lipid  or cholesterol levels are high.  You are older than 62 years of age.  You are at high risk for heart disease.  CANCER SCREENING   Lung Cancer  Lung cancer screening is recommended for adults 55-80 years old who are at high risk for lung cancer because of a history of smoking.  A yearly low-dose CT scan of the lungs is recommended for people who:  Currently smoke.  Have quit within the past 15 years.  Have at least a 30-pack-year history of smoking. A pack year is smoking an average of one pack of cigarettes a day for 1 year.  Yearly screening should continue until it has been 15 years since you quit.  Yearly screening should stop if you develop a health problem that would prevent you from having lung cancer treatment.  Breast Cancer  Practice breast self-awareness. This means understanding how your breasts normally appear and feel.  It also means doing regular breast self-exams. Let your health care provider know about any changes, no matter how small.  If you are in your 20s or 30s, you should have a clinical breast exam (CBE) by a health care provider every 1-3 years as part of a regular health exam.  If you are 40 or older, have a CBE every year. Also consider having a breast X-ray (mammogram) every year.  If you have a family history of breast cancer, talk to your health care provider about genetic screening.  If you   are at high risk for breast cancer, talk to your health care provider about having an MRI and a mammogram every year.  Breast cancer gene (BRCA) assessment is recommended for women who have family members with BRCA-related cancers. BRCA-related cancers include:  Breast.  Ovarian.  Tubal.  Peritoneal cancers.  Results of the assessment will determine the need for genetic counseling and BRCA1 and BRCA2 testing. Cervical Cancer Your health care provider may recommend that you be screened regularly for cancer of the pelvic organs (ovaries, uterus, and  vagina). This screening involves a pelvic examination, including checking for microscopic changes to the surface of your cervix (Pap test). You may be encouraged to have this screening done every 3 years, beginning at age 21.  For women ages 30-65, health care providers may recommend pelvic exams and Pap testing every 3 years, or they may recommend the Pap and pelvic exam, combined with testing for human papilloma virus (HPV), every 5 years. Some types of HPV increase your risk of cervical cancer. Testing for HPV may also be done on women of any age with unclear Pap test results.  Other health care providers may not recommend any screening for nonpregnant women who are considered low risk for pelvic cancer and who do not have symptoms. Ask your health care provider if a screening pelvic exam is right for you.  If you have had past treatment for cervical cancer or a condition that could lead to cancer, you need Pap tests and screening for cancer for at least 20 years after your treatment. If Pap tests have been discontinued, your risk factors (such as having a new sexual partner) need to be reassessed to determine if screening should resume. Some women have medical problems that increase the chance of getting cervical cancer. In these cases, your health care provider may recommend more frequent screening and Pap tests. Colorectal Cancer  This type of cancer can be detected and often prevented.  Routine colorectal cancer screening usually begins at 62 years of age and continues through 62 years of age.  Your health care provider may recommend screening at an earlier age if you have risk factors for colon cancer.  Your health care provider may also recommend using home test kits to check for hidden blood in the stool.  A small camera at the end of a tube can be used to examine your colon directly (sigmoidoscopy or colonoscopy). This is done to check for the earliest forms of colorectal  cancer.  Routine screening usually begins at age 50.  Direct examination of the colon should be repeated every 5-10 years through 62 years of age. However, you may need to be screened more often if early forms of precancerous polyps or small growths are found. Skin Cancer  Check your skin from head to toe regularly.  Tell your health care provider about any new moles or changes in moles, especially if there is a change in a mole's shape or color.  Also tell your health care provider if you have a mole that is larger than the size of a pencil eraser.  Always use sunscreen. Apply sunscreen liberally and repeatedly throughout the day.  Protect yourself by wearing long sleeves, pants, a wide-brimmed hat, and sunglasses whenever you are outside. HEART DISEASE, DIABETES, AND HIGH BLOOD PRESSURE   High blood pressure causes heart disease and increases the risk of stroke. High blood pressure is more likely to develop in:  People who have blood pressure in the high end   of the normal range (130-139/85-89 mm Hg).  People who are overweight or obese.  People who are African American.  If you are 38-23 years of age, have your blood pressure checked every 3-5 years. If you are 61 years of age or older, have your blood pressure checked every year. You should have your blood pressure measured twice--once when you are at a hospital or clinic, and once when you are not at a hospital or clinic. Record the average of the two measurements. To check your blood pressure when you are not at a hospital or clinic, you can use:  An automated blood pressure machine at a pharmacy.  A home blood pressure monitor.  If you are between 45 years and 39 years old, ask your health care provider if you should take aspirin to prevent strokes.  Have regular diabetes screenings. This involves taking a blood sample to check your fasting blood sugar level.  If you are at a normal weight and have a low risk for diabetes,  have this test once every three years after 62 years of age.  If you are overweight and have a high risk for diabetes, consider being tested at a younger age or more often. PREVENTING INFECTION  Hepatitis B  If you have a higher risk for hepatitis B, you should be screened for this virus. You are considered at high risk for hepatitis B if:  You were born in a country where hepatitis B is common. Ask your health care provider which countries are considered high risk.  Your parents were born in a high-risk country, and you have not been immunized against hepatitis B (hepatitis B vaccine).  You have HIV or AIDS.  You use needles to inject street drugs.  You live with someone who has hepatitis B.  You have had sex with someone who has hepatitis B.  You get hemodialysis treatment.  You take certain medicines for conditions, including cancer, organ transplantation, and autoimmune conditions. Hepatitis C  Blood testing is recommended for:  Everyone born from [birth year] through 1967.  Anyone with known risk factors for hepatitis C. Sexually transmitted infections (STIs)  You should be screened for sexually transmitted infections (STIs) including gonorrhea and chlamydia if:  You are sexually active and are younger than 62 years of age.  You are older than 62 years of age and your health care provider tells you that you are at risk for this type of infection.  Your sexual activity has changed since you were last screened and you are at an increased risk for chlamydia or gonorrhea. Ask your health care provider if you are at risk.  If you do not have HIV, but are at risk, it may be recommended that you take a prescription medicine daily to prevent HIV infection. This is called pre-exposure prophylaxis (PrEP). You are considered at risk if:  You are sexually active and do not regularly use condoms or know the HIV status of your partner(s).  You take drugs by injection.  You are sexually  active with a partner who has HIV. Talk with your health care provider about whether you are at high risk of being infected with HIV. If you choose to begin PrEP, you should first be tested for HIV. You should then be tested every 3 months for as long as you are taking PrEP.  PREGNANCY   If you are premenopausal and you may become pregnant, ask your health care provider about preconception counseling.  If you may  become pregnant, take 400 to 800 micrograms (mcg) of folic acid every day.  If you want to prevent pregnancy, talk to your health care provider about birth control (contraception). OSTEOPOROSIS AND MENOPAUSE   Osteoporosis is a disease in which the bones lose minerals and strength with aging. This can result in serious bone fractures. Your risk for osteoporosis can be identified using a bone density scan.  If you are 61 years of age or older, or if you are at risk for osteoporosis and fractures, ask your health care provider if you should be screened.  Ask your health care provider whether you should take a calcium or vitamin D supplement to lower your risk for osteoporosis.  Menopause may have certain physical symptoms and risks.  Hormone replacement therapy may reduce some of these symptoms and risks. Talk to your health care provider about whether hormone replacement therapy is right for you.  HOME CARE INSTRUCTIONS   Schedule regular health, dental, and eye exams.  Stay current with your immunizations.   Do not use any tobacco products including cigarettes, chewing tobacco, or electronic cigarettes.  If you are pregnant, do not drink alcohol.  If you are breastfeeding, limit how much and how often you drink alcohol.  Limit alcohol intake to no more than 1 drink per day for nonpregnant women. One drink equals 12 ounces of beer, 5 ounces of wine, or 1 ounces of hard liquor.  Do not use street drugs.  Do not share needles.  Ask your health care provider for help if  you need support or information about quitting drugs.  Tell your health care provider if you often feel depressed.  Tell your health care provider if you have ever been abused or do not feel safe at home.   This information is not intended to replace advice given to you by your health care provider. Make sure you discuss any questions you have with your health care provider.   Document Released: 02/28/2011 Document Revised: 09/05/2014 Document Reviewed: 07/17/2013 Elsevier Interactive Patient Education [employer].

## 2016-01-29 LAB — IPS PAP TEST WITH HPV

## 2016-03-18 IMAGING — CT CT MAXILLOFACIAL W/O CM
3 series · 15 of 47 positions shown, 18 images · non-contrast
Comparison: None.

CLINICAL DATA: Chronic sinusitis for 5 months with left-sided
facial pain and congestion. Surgical planning.

EXAM:
CT MAXILLOFACIAL WITHOUT CONTRAST
TECHNIQUE: Multidetector CT imaging of the maxillofacial structures was
performed. Multiplanar CT image reconstructions were also generated.
A small metallic BB was placed on the right temple in order to
reliably differentiate right from left.

[Series 4: soft tissue · axial · 0.45mm/px · z∈[-193,-38]mm · 9 of 181 slices shown, 12 images]
[im 13/181  brain]
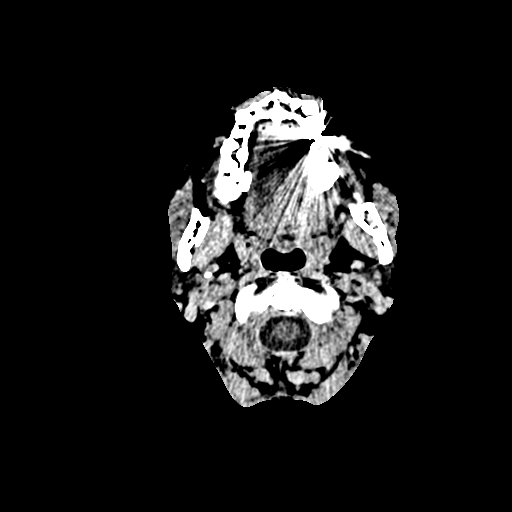
[im 13/181  bone]
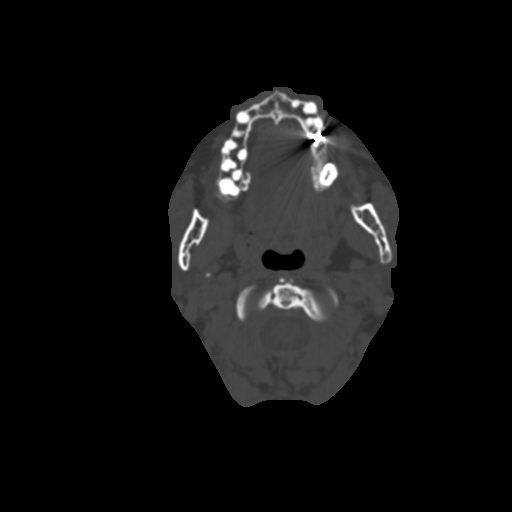
[im 32/181  bone]
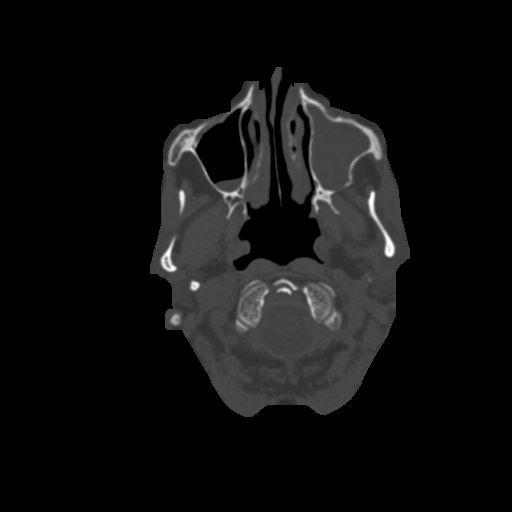
[im 50/181  bone]
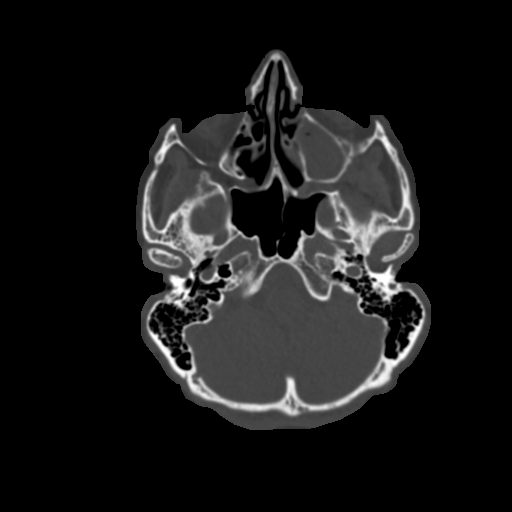
[im 69/181  bone]
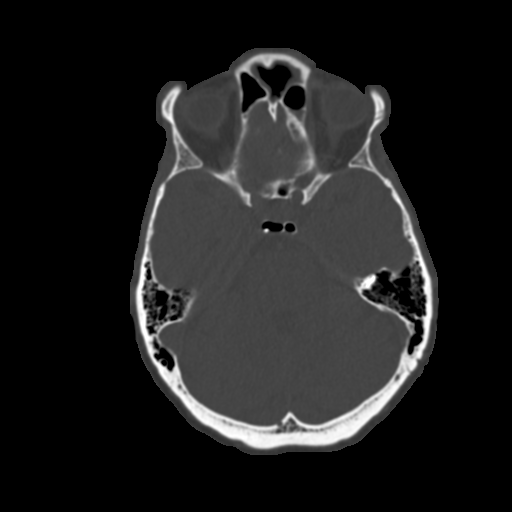
[im 94/181  brain]
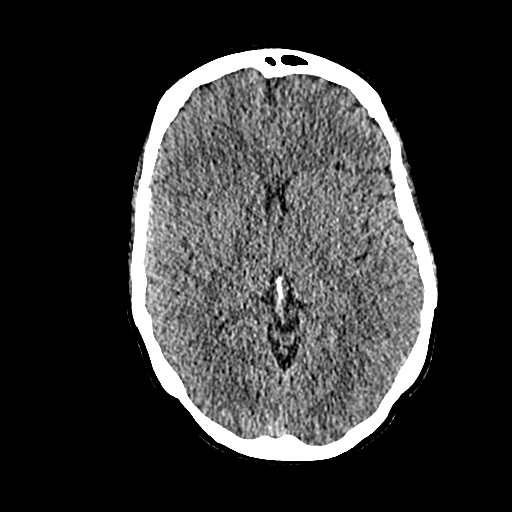
[im 94/181  bone]
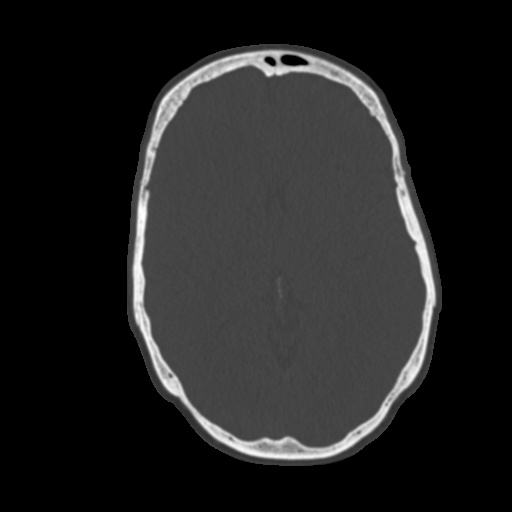
[im 112/181  bone]
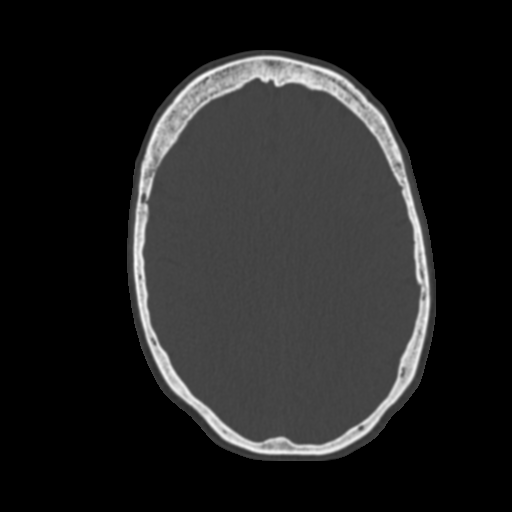
[im 131/181  bone]
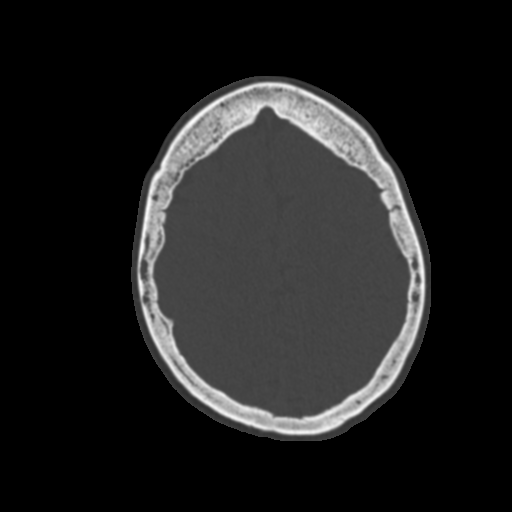
[im 149/181  bone]
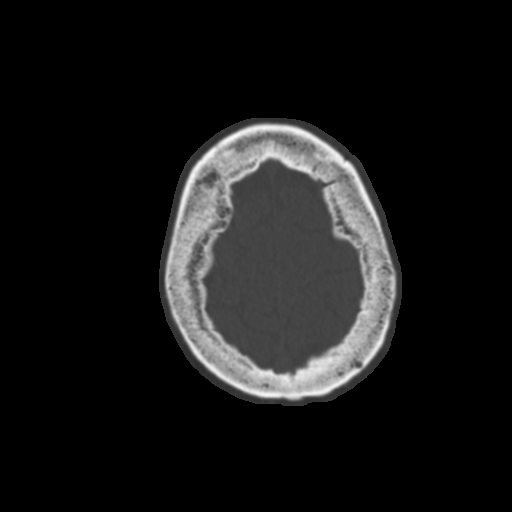
[im 168/181  brain]
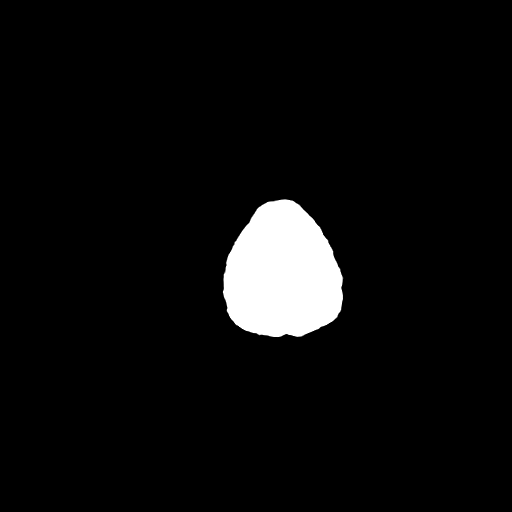
[im 168/181  bone]
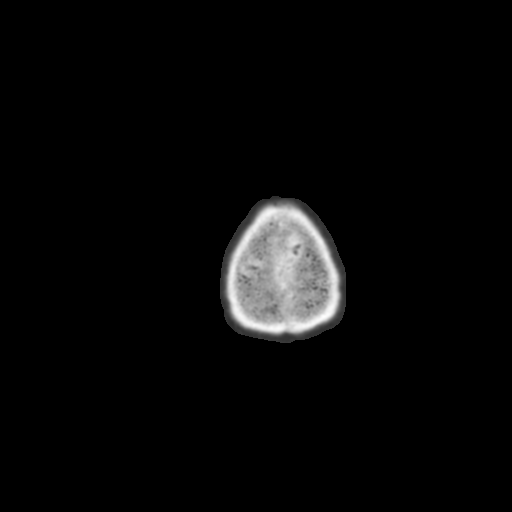

[Series 7: cor soft · coronal · 0.31mm/px · 3 of 97 slices shown]
[im 33/97  bone]
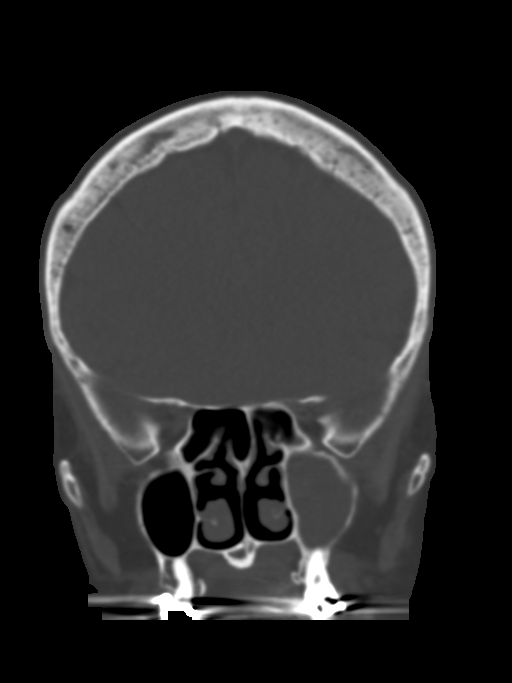
[im 43/97  bone]
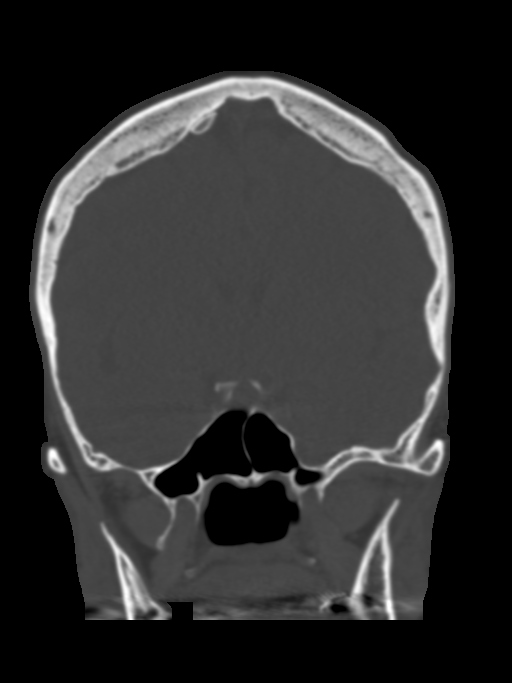
[im 54/97  bone]
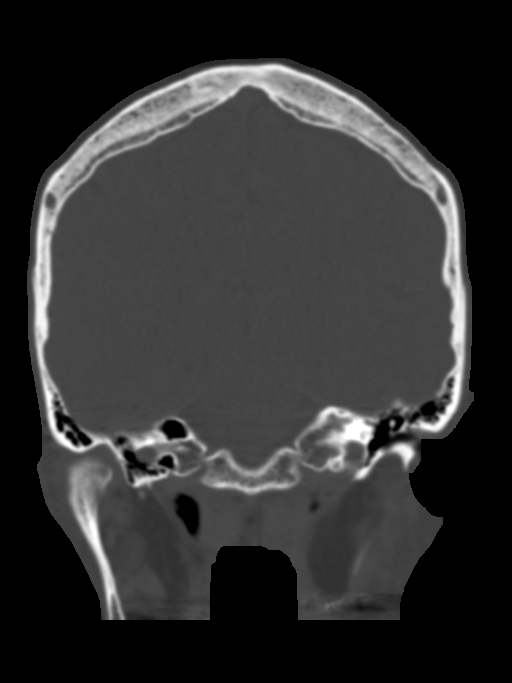

[Series 8: sag soft · sagittal · 0.42mm/px · 3 of 72 slices shown]
[im 24/72  bone]
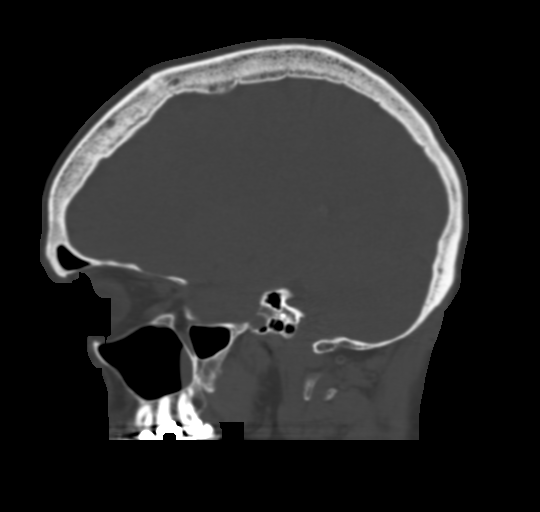
[im 36/72  bone]
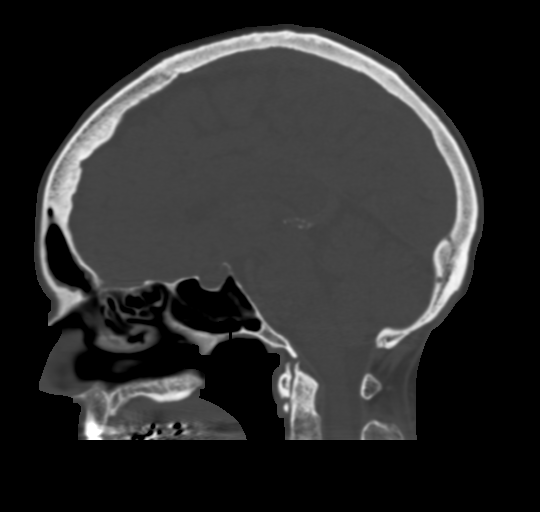
[im 48/72  bone]
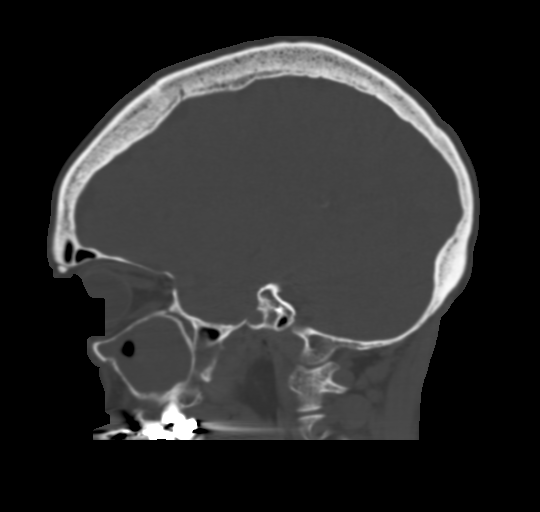

[15 of 47 positions shown; findings below may reference images not displayed]

FINDINGS: There is extensive mucosal edema circumferentially in the left
maxillary antrum with high-density contents, likely inspissated
secretions. The wall is mildly sclerotic compatible with chronic
inflammation. Left maxillary infundibular stenosis is mainly from
mucosal thickening, although there is some narrowing from above by
ethmoid air cells. Fat infiltration in the left pterygopalatine
fossa and retroantral fat ridges presumably from acute inflammation.
There is no chart indication of chronic immunodeficiency or diabetes
as usually required for invasive disease. No bone destruction
suggestive of an aggressive mass.

Mild mucosal thickening in the posterior right maxillary antrum with
patent right maxillary outflow.

Mild moderate mucosal edema in the left frontal sinus without fluid
level. Bilateral frontal sinus outflow is patent. Bilateral sphenoid
sinus ostia are widely patent. Rightward nasal septal deviation and
spurring with mild middle turbinate mass effect.

Intracranial and orbital imaging is negative.

These results were called by telephone at the time of interpretation
on 04/29/2015 at [DATE] to Dr. SHOBHA KWIATKOWSKI , who verbally acknowledged
these results.
IMPRESSION: 1. Acute on chronic left maxillary sinusitis. Periantral fat
infiltration may reflect edema/congestion from acute inflammation
but early invasive sinusitis has the same appearance and should be
considered.
2. Chronic left frontal sinusitis without obstruction.

## 2016-03-23 DIAGNOSIS — H35063 Retinal vasculitis, bilateral: Secondary | ICD-10-CM | POA: Diagnosis not present

## 2016-03-23 DIAGNOSIS — H43811 Vitreous degeneration, right eye: Secondary | ICD-10-CM | POA: Diagnosis not present

## 2016-03-23 DIAGNOSIS — H35073 Retinal telangiectasis, bilateral: Secondary | ICD-10-CM | POA: Diagnosis not present

## 2016-03-23 DIAGNOSIS — H35352 Cystoid macular degeneration, left eye: Secondary | ICD-10-CM | POA: Diagnosis not present

## 2016-04-18 ENCOUNTER — Encounter: Payer: Self-pay | Admitting: *Deleted

## 2016-04-18 ENCOUNTER — Emergency Department
Admission: EM | Admit: 2016-04-18 | Discharge: 2016-04-18 | Disposition: A | Discharge status: Home / Self Care | Payer: BLUE CROSS/BLUE SHIELD | Source: Home / Self Care | Attending: Family Medicine | Admitting: Family Medicine

## 2016-04-18 DIAGNOSIS — D179 Benign lipomatous neoplasm, unspecified: Secondary | ICD-10-CM

## 2016-04-18 DIAGNOSIS — Z23 Encounter for immunization: Secondary | ICD-10-CM | POA: Diagnosis not present

## 2016-04-18 DIAGNOSIS — R229 Localized swelling, mass and lump, unspecified: Secondary | ICD-10-CM

## 2016-04-18 MED ORDER — TETANUS-DIPHTH-ACELL PERTUSSIS 5-2.5-18.5 LF-MCG/0.5 IM SUSP
0.5000 mL | Freq: Once | INTRAMUSCULAR | Status: AC
Start: 2016-04-18 — End: 2016-04-18
  Administered 2016-04-18: 0.5 mL via INTRAMUSCULAR

## 2016-04-18 NOTE — Discharge Instructions (Signed)
°  Please follow up with a primary care provider or dermatologist for a skin biopsy of skin lesions on your back to make sure they are benign (non-cancerous)  If lumps on your back become larger, red, warm, painful, or you develop fever, nausea, vomiting, please have lesions re-evaluated immediately as they could be infected.  You would then need an antibiotic.

## 2016-04-18 NOTE — ED Provider Notes (Signed)
CSN: [id number]     Arrival date & time 04/18/16  [id number] History   First MD Initiated Contact with Patient 04/18/16 1021     Chief Complaint  Patient presents with  . Mass  . TDaP   (Consider location/radiation/quality/duration/timing/severity/associated sxs/prior Treatment) HPI  Melissa Russell is a 60 y.o. female presenting to UC with concern after feeling a lump on her mid to lower back about 3 days ago.  She also notes there is a darkened center.  Denies pain, redness, worsening swelling. Denies drainage or bleeding. She is unsure how long it has been there. Denies fever, chills, rashes or known insect bites. She has not tried any medications or cream on the lump.  She was advised a few years ago she had a lipoma on her upper back.  She is also requesting her tetanus be updated.    Past Medical History:  Diagnosis Date  . Hypertension    History reviewed. No pertinent surgical history. Family History  Problem Relation Age of Onset  . Cancer Mother     bladder cancer  . Cancer Father     colon cancer   Social History  Substance Use Topics  . Smoking status: Never Smoker  . Smokeless tobacco: Never Used  . Alcohol use No   OB History    Gravida Para Term Preterm AB Living   0 0 0 0 0 0   SAB TAB Ectopic Multiple Live Births   0 0 0 0       Review of Systems  Constitutional: Negative for chills and fever.  Musculoskeletal: Negative for arthralgias, back pain and myalgias.  Skin: Positive for color change. Negative for rash and wound.    Allergies  Augmentin [amoxicillin-pot clavulanate] and Clindamycin/lincomycin  Home Medications   Prior to Admission medications   Medication Sig Start Date End Date Taking? Authorizing Provider  Polyethyl Glycol-Propyl Glycol (SYSTANE OP) Apply 1 drop to eye daily.    Historical Provider, MD  Probiotic Product (PROBIOTIC PO) Take by mouth. Takes 2 daily    Historical Provider, MD  sulindac (CLINORIL) 200 MG tablet Take by mouth.  Take 1/2 tablet qod    Historical Provider, MD   Meds Ordered and Administered this Visit   Medications  Tdap (BOOSTRIX) injection 0.5 mL (0.5 mLs Intramuscular Given 04/18/16 1023)    BP 156/74 (BP Location: Left Arm)   Pulse 115   Temp 98.2 F (36.8 C) (Oral)   Wt 126 lb (57.2 kg)   LMP 08/29/2008   SpO2 99%   BMI 20.49 kg/m  No data found.   Physical Exam  Constitutional: She is oriented to person, place, and time. She appears well-developed and well-nourished.  HENT:  Head: Normocephalic and atraumatic.  Eyes: EOM are normal.  Neck: Normal range of motion.  Cardiovascular: Normal rate.   Pulmonary/Chest: Effort normal.  Musculoskeletal: Normal range of motion. She exhibits no tenderness.  No midline spinal tenderness  Neurological: She is alert and oriented to person, place, and time.  Skin: Skin is warm and dry. Capillary refill takes less than 2 seconds. No erythema.  3cm circular non-tender round mass just left to lower thoracic spine. No erythema or warmth. No bleeding or drainage. centralized darkened center. 1cm similar appearing lesion superior to larger lesion just left of thoracic spine.    Psychiatric: She has a normal mood and affect. Her behavior is normal.  Nursing note and vitals reviewed.   Urgent Care Course   Clinical Course  Procedures (including critical care time)  Labs Review Labs Reviewed - No data to display  Imaging Review No results found.   MDM   1. Need for tetanus booster   2. Lipoma   3. Lumps on the skin    Pt c/o a non-painful lump on her back. No erythema, warmth or tenderness. No fluctuance. Low concern for abscess or underlying infection at this time.  Pt has another smaller similar appearing mass.  Will hold off on antibiotics. Masses likely benign, lipoma type lesions, however, encouraged f/u with PCP or dermatology for biopsy, especially if lesions change. Patient verbalized understanding and agreement with  treatment plan.    Noland Fordyce, PA-C 04/18/16 1145

## 2016-04-18 NOTE — ED Triage Notes (Signed)
Patient reports noticing a lump with a dark center on her lower spine 3 days ago. No pain or drainage, denies feeling a bite of some sort. Swelling present above site.  Also request a TDaP

## 2016-06-28 ENCOUNTER — Ambulatory Visit (AMBULATORY_SURGERY_CENTER): Payer: Self-pay

## 2016-06-28 VITALS — Ht 65.0 in | Wt 127.6 lb

## 2016-06-28 DIAGNOSIS — Z860100 Personal history of colon polyps, unspecified: Secondary | ICD-10-CM

## 2016-07-01 ENCOUNTER — Encounter: Payer: Self-pay | Admitting: Internal Medicine

## 2016-07-12 ENCOUNTER — Ambulatory Visit (AMBULATORY_SURGERY_CENTER): Payer: BLUE CROSS/BLUE SHIELD | Admitting: Internal Medicine

## 2016-07-12 ENCOUNTER — Encounter: Payer: Self-pay | Admitting: Internal Medicine

## 2016-07-12 VITALS — BP 138/73 | HR 82 | Temp 98.6°F | Resp 12 | Ht 65.0 in | Wt 127.0 lb

## 2016-07-12 DIAGNOSIS — Z1212 Encounter for screening for malignant neoplasm of rectum: Secondary | ICD-10-CM | POA: Diagnosis not present

## 2016-07-12 DIAGNOSIS — Z1211 Encounter for screening for malignant neoplasm of colon: Secondary | ICD-10-CM

## 2016-07-12 DIAGNOSIS — Z800 Family history of malignant neoplasm of digestive organs: Secondary | ICD-10-CM | POA: Diagnosis present

## 2016-07-12 HISTORY — PX: COLONOSCOPY: SHX174

## 2016-07-12 MED ORDER — SODIUM CHLORIDE 0.9 % IV SOLN: 500.0000 mL | INTRAVENOUS | Status: DC

## 2016-07-12 NOTE — Progress Notes (Signed)
No problems noted in the recovery room. maw 

## 2016-07-12 NOTE — Progress Notes (Signed)
Report given to PACU RN, vss 

## 2016-07-12 NOTE — Patient Instructions (Signed)
YOU HAD AN ENDOSCOPIC PROCEDURE TODAY AT [facility]:   Refer to the procedure report that was given to you for any specific questions about what was found during the examination.  If the procedure report does not answer your questions, please call your gastroenterologist to clarify.  If you requested that your care partner not be given the details of your procedure findings, then the procedure report has been included in a sealed envelope for you to review at your convenience later.  YOU SHOULD EXPECT: Some feelings of bloating in the abdomen. Passage of more gas than usual.  Walking can help get rid of the air that was put into your GI tract during the procedure and reduce the bloating. If you had a lower endoscopy (such as a colonoscopy or flexible sigmoidoscopy) you may notice spotting of blood in your stool or on the toilet paper. If you underwent a bowel prep for your procedure, you may not have a normal bowel movement for a few days.  Please Note:  You might notice some irritation and congestion in your nose or some drainage.  This is from the oxygen used during your procedure.  There is no need for concern and it should clear up in a day or so.  SYMPTOMS TO REPORT IMMEDIATELY:   Following lower endoscopy (colonoscopy or flexible sigmoidoscopy):  Excessive amounts of blood in the stool  Significant tenderness or worsening of abdominal pains  Swelling of the abdomen that is new, acute  Fever of 100F or higher   Following upper endoscopy (EGD)  Vomiting of blood or coffee ground material  New chest pain or pain under the shoulder blades  Painful or persistently difficult swallowing  New shortness of breath  Fever of 100F or higher  Black, tarry-looking stools  For urgent or emergent issues, a gastroenterologist can be reached at any hour by calling [phone].   DIET:  We do recommend a small meal at first, but then you may proceed to your regular diet.  Drink  plenty of fluids but you should avoid alcoholic beverages for 24 hours.  ACTIVITY:  You should plan to take it easy for the rest of today and you should NOT DRIVE or use heavy machinery until tomorrow (because of the sedation medicines used during the test).    FOLLOW UP: Our staff will call the number listed on your records the next business day following your procedure to check on you and address any questions or concerns that you may have regarding the information given to you following your procedure. If we do not reach you, we will leave a message.  However, if you are feeling well and you are not experiencing any problems, there is no need to return our call.  We will assume that you have returned to your regular daily activities without incident.  If any biopsies were taken you will be contacted by phone or by letter within the next 1-3 weeks.  Please call us at [phone] if you have not heard about the biopsies in 3 weeks.    SIGNATURES/CONFIDENTIALITY: You and/or your care partner have signed paperwork which will be entered into your electronic medical record.  These signatures attest to the fact that that the information above on your After Visit Summary has been reviewed and is understood.  Full responsibility of the confidentiality of this discharge information lies with you and/or your care-partner.   Handout was given to your care partner on diverticulosis. You  may resume your current medications today. Repeat colonoscopy in 5 years for screening purposes. Please call if any questions or concerns.

## 2016-07-12 NOTE — Op Note (Signed)
[facility] Patient Name: Melissa Russell Procedure Date: 07/12/2016 1:36 PM MRN: [id number] Endoscopist: Gatha Mayer , MD Age: 60 Referring MD:  Date of Birth: 29-Aug-1956 Gender: Female Account #: [id number] Procedure:                Colonoscopy Indications:              Screening in patient at increased risk: Family                            history of 1st-degree relative with colorectal                            cancer Medicines:                Propofol per Anesthesia, Monitored Anesthesia Care Procedure:                Pre-Anesthesia Assessment:                           - Prior to the procedure, a History and Physical                            was performed, and patient medications and                            allergies were reviewed. The patient's tolerance of                            previous anesthesia was also reviewed. The risks                            and benefits of the procedure and the sedation                            options and risks were discussed with the patient.                            All questions were answered, and informed consent                            was obtained. Prior Anticoagulants: The patient has                            taken no previous anticoagulant or antiplatelet                            agents. ASA Grade Assessment: II - A patient with                            mild systemic disease. After reviewing the risks                            and benefits, the patient was deemed in  satisfactory condition to undergo the procedure.                           After obtaining informed consent, the colonoscope                            was passed under direct vision. Throughout the                            procedure, the patient's blood pressure, pulse, and                            oxygen saturations were monitored continuously. The                            Model PCF-[id number])  scope was introduced                            through the anus and advanced to the the cecum,                            identified by appendiceal orifice and ileocecal                            valve. The colonoscopy was performed without                            difficulty. The patient tolerated the procedure                            well. The quality of the bowel preparation was                            good. The bowel preparation used was Miralax. The                            ileocecal valve, appendiceal orifice, and rectum                            were photographed. Scope In: 1:41:01 PM Scope Out: 1:55:58 PM Scope Withdrawal Time: 0 hours 11 minutes 23 seconds  Total Procedure Duration: 0 hours 14 minutes 57 seconds  Findings:                 The perianal and digital rectal examinations were                            normal.                           A few diverticula were found in the sigmoid colon.                           The exam was otherwise without abnormality on  direct and retroflexion views. Complications:            No immediate complications. Estimated Blood Loss:     Estimated blood loss: none. Impression:               - Diverticulosis in the sigmoid colon.                           - The examination was otherwise normal on direct                            and retroflexion views.                           - No specimens collected. Recommendation:           - Patient has a contact number available for                            emergencies. The signs and symptoms of potential                            delayed complications were discussed with the                            patient. Return to normal activities tomorrow.                            Written discharge instructions were provided to the                            patient.                           - Continue present medications.                           - Resume  previous diet.                           - Repeat colonoscopy in 5 years for screening                            purposes. Gatha Mayer, MD 07/12/2016 2:05:16 PM This report has been signed electronically.

## 2016-07-13 ENCOUNTER — Telehealth: Payer: Self-pay

## 2016-07-13 NOTE — Telephone Encounter (Signed)
  Follow up Call-  Call back number 07/12/2016  Post procedure Call Back phone  # [phone]  Permission to leave phone message Yes  Some recent data might be hidden     Patient questions:  Do you have a fever, pain , or abdominal swelling? No. Pain Score  0 *  Have you tolerated food without any problems? No.  Have you been able to return to your normal activities? Yes.    Do you have any questions about your discharge instructions: Diet   No. Medications  No. Follow up visit  No.  Do you have questions or concerns about your Care? Yes.   Pt. Reported after eating a small meal last night, she immediately had diarrhea as she did with the prep, and was concerned about eating this morning.   Encouraged pt. To eat a small light meal, but to call if diarrhea or other concerns persisted.  Pt. Denies fever, pain or abdominal swellling.Actions: * If pain score is 4 or above: No action needed, pain <4.

## 2016-08-17 DIAGNOSIS — H04123 Dry eye syndrome of bilateral lacrimal glands: Secondary | ICD-10-CM | POA: Diagnosis not present

## 2016-08-17 DIAGNOSIS — H35353 Cystoid macular degeneration, bilateral: Secondary | ICD-10-CM | POA: Diagnosis not present

## 2016-08-17 DIAGNOSIS — H2513 Age-related nuclear cataract, bilateral: Secondary | ICD-10-CM | POA: Diagnosis not present

## 2016-09-13 ENCOUNTER — Other Ambulatory Visit: Payer: Self-pay | Admitting: Obstetrics and Gynecology

## 2016-09-13 DIAGNOSIS — Z1231 Encounter for screening mammogram for malignant neoplasm of breast: Secondary | ICD-10-CM

## 2016-10-11 ENCOUNTER — Ambulatory Visit
Admission: RE | Admit: 2016-10-11 | Discharge: 2016-10-11 | Disposition: A | Discharge status: Home / Self Care | Payer: BLUE CROSS/BLUE SHIELD | Source: Ambulatory Visit | Attending: Obstetrics and Gynecology | Admitting: Obstetrics and Gynecology

## 2016-10-11 DIAGNOSIS — Z1231 Encounter for screening mammogram for malignant neoplasm of breast: Secondary | ICD-10-CM | POA: Diagnosis not present

## 2016-12-06 DIAGNOSIS — H18051 Posterior corneal pigmentations, right eye: Secondary | ICD-10-CM | POA: Diagnosis not present

## 2016-12-06 DIAGNOSIS — H35063 Retinal vasculitis, bilateral: Secondary | ICD-10-CM | POA: Diagnosis not present

## 2016-12-06 DIAGNOSIS — H35352 Cystoid macular degeneration, left eye: Secondary | ICD-10-CM | POA: Diagnosis not present

## 2016-12-06 DIAGNOSIS — H35073 Retinal telangiectasis, bilateral: Secondary | ICD-10-CM | POA: Diagnosis not present

## 2017-03-08 ENCOUNTER — Ambulatory Visit: Payer: BLUE CROSS/BLUE SHIELD | Admitting: Obstetrics and Gynecology

## 2017-03-23 ENCOUNTER — Ambulatory Visit (INDEPENDENT_AMBULATORY_CARE_PROVIDER_SITE_OTHER): Payer: BLUE CROSS/BLUE SHIELD | Admitting: Obstetrics and Gynecology

## 2017-03-23 ENCOUNTER — Encounter: Payer: Self-pay | Admitting: Obstetrics and Gynecology

## 2017-03-23 VITALS — BP 116/60 | HR 88 | Resp 16 | Ht 65.25 in | Wt 131.0 lb

## 2017-03-23 DIAGNOSIS — Z780 Asymptomatic menopausal state: Secondary | ICD-10-CM | POA: Diagnosis not present

## 2017-03-23 DIAGNOSIS — Z01419 Encounter for gynecological examination (general) (routine) without abnormal findings: Secondary | ICD-10-CM

## 2017-03-23 NOTE — Progress Notes (Deleted)
61 y.o. G10P0000 Married Caucasian female here for annual exam.    PCP:     Patient's last menstrual period was 08/29/2008.           Sexually active: Yes.    The current method of family planning is post menopausal status.    Exercising: No.  dance, walking Smoker:  yes  Health Maintenance: Pap:  *** History of abnormal Pap:  {YES NO:22349} MMG:  *** Colonoscopy:  *** BMD:   ***  Result  *** TDaP:  *** Gardasil:   {YES NO:22349} HIV: Hep C: Screening Labs:  Hb today: ***, Urine today: ***   reports that she has never smoked. She has never used smokeless tobacco. She reports that she does not drink alcohol or use drugs.  Past Medical History:  Diagnosis Date  . Dental disease    having implants d/t tooth growing up into sinuses  . Heart valve disorder    "leaking"  . Hypertension     Past Surgical History:  Procedure Laterality Date  . COLONOSCOPY      Current Outpatient Prescriptions  Medication Sig Dispense Refill  . cephALEXin (KEFLEX) 500 MG capsule TAKE 4 CAPSULES 1 HOUR PRIOR TO SURGERY, THEN 1 CAPSULE 4 TIMES DAILY UNTIL GONE  0  . Polyethyl Glycol-Propyl Glycol (SYSTANE OP) Apply 1 drop to eye daily.    . Probiotic Product (PROBIOTIC PO) Take by mouth. Takes 2 daily     Current Facility-Administered Medications  Medication Dose Route Frequency Provider Last Rate Last Dose  . 0.9 %  sodium chloride infusion  500 mL Intravenous Continuous Gatha Mayer, MD        Family History  Problem Relation Age of Onset  . Cancer Mother        bladder cancer  . Cancer Father        colon cancer  . Colon cancer Father 6  . Esophageal cancer Neg Hx   . Rectal cancer Neg Hx   . Stomach cancer Neg Hx     ROS:  Pertinent items are noted in HPI.  Otherwise, a comprehensive ROS was negative.  Exam:   LMP 08/29/2008     General appearance: alert, cooperative and appears stated age Head: Normocephalic, without obvious abnormality, atraumatic Neck: no  adenopathy, supple, symmetrical, trachea midline and thyroid normal to inspection and palpation Lungs: clear to auscultation bilaterally Breasts: normal appearance, no masses or tenderness, No nipple retraction or dimpling, No nipple discharge or bleeding, No axillary or supraclavicular adenopathy Heart: regular rate and rhythm Abdomen: soft, non-tender; no masses, no organomegaly Extremities: extremities normal, atraumatic, no cyanosis or edema Skin: Skin color, texture, turgor normal. No rashes or lesions Lymph nodes: Cervical, supraclavicular, and axillary nodes normal. No abnormal inguinal nodes palpated Neurologic: Grossly normal  Pelvic: External genitalia:  no lesions              Urethra:  normal appearing urethra with no masses, tenderness or lesions              Bartholins and Skenes: normal                 Vagina: normal appearing vagina with normal color and discharge, no lesions              Cervix: no lesions              Pap taken: {yes no:314532} Bimanual Exam:  Uterus:  normal size, contour, position, consistency, mobility, non-tender  Adnexa: no mass, fullness, tenderness              Rectal exam: {yes no:314532}.  Confirms.              Anus:  normal sphincter tone, no lesions  Chaperone was present for exam.  Assessment:   Well woman visit with normal exam.   Plan: Mammogram screening discussed. Recommended self breast awareness. Pap and HR HPV as above. Guidelines for Calcium, Vitamin D, regular exercise program including cardiovascular and weight bearing exercise.   Follow up annually and prn.   Additional counseling given.  {yes [id number]. _______ minutes face to face time of which over 50% was spent in counseling.    After visit summary provided.

## 2017-03-23 NOTE — Patient Instructions (Signed)

## 2017-03-23 NOTE — Progress Notes (Signed)
61 y.o. G0P0000 Married Caucasian female here for annual exam.    No concerns.   No vaginal bleeding. No pain.  No bladder or bowel concerns.   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 Works at [employer] also.   PCP:   [facility] in [place]  Patient's last menstrual period was 08/29/2008.           Sexually active: Yes.    The current method of family planning is post menopausal status.    Exercising: Yes.    dance, walking Smoker:  no  Health Maintenance: Pap:  01/27/16 Neg. HR HPV:neg   07/18/13 Neg. HR HPV:neg  History of abnormal Pap:  no MMG:  10/11/16 BIRADS1:neg  Colonoscopy:  07/12/16 normal. f/u 5 years  BMD:   n/a  Result  n/a TDaP:  2017  HIV: around 2002  Hep C: around 2002 Screening Labs:  PCP takes care of labs   reports that she has never smoked. She has never used smokeless tobacco. She reports that she does not drink alcohol or use drugs.  Past Medical History:  Diagnosis Date  . Dental disease    having implants d/t tooth growing up into sinuses  . Heart valve disorder    "leaking"  . Hypertension     Past Surgical History:  Procedure Laterality Date  . COLONOSCOPY      No current outpatient prescriptions on file.   Current Facility-Administered Medications  Medication Dose Route Frequency Provider Last Rate Last Dose  . 0.9 %  sodium chloride infusion  500 mL Intravenous Continuous Gatha Mayer, MD        Family History  Problem Relation Age of Onset  . Cancer Mother        bladder cancer  . Cancer Father        colon cancer  . Colon cancer Father 5  . Esophageal cancer Neg Hx   . Rectal cancer Neg Hx   . Stomach cancer Neg Hx     ROS:  Pertinent items are noted in HPI.  Otherwise, a comprehensive ROS was negative.  Exam:   BP 116/60 (BP Location: Right Arm, Patient Position: Sitting, Cuff Size: Normal)   Pulse 88   Resp 16   Ht 5' 5.25" (1.657 m)   Wt 131 lb (59.4 kg)   LMP 08/29/2008   BMI 21.63 kg/m      General appearance: alert, cooperative and appears stated age Head: Normocephalic, without obvious abnormality, atraumatic Neck: no adenopathy, supple, symmetrical, trachea midline and thyroid normal to inspection and palpation Lungs: clear to auscultation bilaterally Breasts: normal appearance, no masses or tenderness, No nipple retraction or dimpling, No nipple discharge or bleeding, No axillary or supraclavicular adenopathy Heart: regular rate and rhythm Abdomen: soft, non-tender; no masses, no organomegaly Extremities: extremities normal, atraumatic, no cyanosis or edema Skin: Skin color, texture, turgor normal. No rashes or lesions Lymph nodes: Cervical, supraclavicular, and axillary nodes normal. No abnormal inguinal nodes palpated Neurologic: Grossly normal  Pelvic: External genitalia:  no lesions              Urethra:  normal appearing urethra with no masses, tenderness or lesions              Bartholins and Skenes: normal                 Vagina: normal appearing vagina with normal color and discharge, no lesions  Cervix: no lesions              Pap taken: No. Bimanual Exam:  Uterus:  normal size, contour, position, consistency, mobility, non-tender              Adnexa: no mass, fullness, tenderness              Rectal exam: Yes.  .  Confirms.              Anus:  normal sphincter tone, no lesions  Chaperone was present for exam.  Assessment:   Well woman visit with normal exam.  Plan: Mammogram screening discussed. Recommended self breast awareness. Pap and HR HPV as above. Guidelines for Calcium, Vitamin D, regular exercise program including cardiovascular and weight bearing exercise. BMD ordered for her to do with next mammogram. Follow up annually and prn.   After visit summary provided.

## 2017-06-05 DIAGNOSIS — L03115 Cellulitis of right lower limb: Secondary | ICD-10-CM | POA: Diagnosis not present

## 2017-06-13 DIAGNOSIS — L03115 Cellulitis of right lower limb: Secondary | ICD-10-CM | POA: Diagnosis not present

## 2017-06-13 DIAGNOSIS — H35352 Cystoid macular degeneration, left eye: Secondary | ICD-10-CM | POA: Diagnosis not present

## 2017-06-13 DIAGNOSIS — H2511 Age-related nuclear cataract, right eye: Secondary | ICD-10-CM | POA: Diagnosis not present

## 2017-06-13 DIAGNOSIS — H2512 Age-related nuclear cataract, left eye: Secondary | ICD-10-CM | POA: Diagnosis not present

## 2017-06-13 DIAGNOSIS — H35063 Retinal vasculitis, bilateral: Secondary | ICD-10-CM | POA: Diagnosis not present

## 2017-06-20 ENCOUNTER — Emergency Department (INDEPENDENT_AMBULATORY_CARE_PROVIDER_SITE_OTHER)
Admission: EM | Admit: 2017-06-20 | Discharge: 2017-06-20 | Disposition: A | Discharge status: Home / Self Care | Payer: BLUE CROSS/BLUE SHIELD | Source: Home / Self Care | Attending: Family Medicine | Admitting: Family Medicine

## 2017-06-20 ENCOUNTER — Encounter: Payer: Self-pay | Admitting: *Deleted

## 2017-06-20 DIAGNOSIS — L089 Local infection of the skin and subcutaneous tissue, unspecified: Secondary | ICD-10-CM | POA: Diagnosis not present

## 2017-06-20 DIAGNOSIS — L723 Sebaceous cyst: Secondary | ICD-10-CM | POA: Diagnosis not present

## 2017-06-20 MED ORDER — DOXYCYCLINE HYCLATE 100 MG PO CAPS: 100.0000 mg | ORAL_CAPSULE | Freq: Two times a day (BID) | ORAL | 0 refills | Status: DC

## 2017-06-20 NOTE — ED Triage Notes (Signed)
Pt c/o nonhealing wounds on the inside of her RT upper thigh x 06/02/17. She reports going to [facility] urgent care on 06/05/17 was given 10 days of Doxycyline. Reports that the infection is better, but the sores have not healed.

## 2017-06-20 NOTE — ED Provider Notes (Signed)
[facility]    CSN: [id number] Arrival date & time: 06/20/17  1310     History   Chief Complaint Chief Complaint  Patient presents with  . Abscess    HPI Melissa Russell is a 61 y.o. female.   Patient noticed a small nodule on her right inner thigh several weeks ago that became increasingly red and painful.  She presented to an [facility] urgent care center where she was prescribed doxycycline for 10 days.  The redness resolved but the lesion has not decreased in size and is still mildly painful.   The history is provided by the patient.  Abscess  Abscess location: right inner thigh. Size:  1.5cm Abscess quality: fluctuance, painful, redness and weeping   Abscess quality: not draining and no warmth   Red streaking: no   Progression:  Partially resolved Pain details:    Quality:  Aching   Severity:  Mild   Duration:  2 weeks   Timing:  Constant   Progression:  Partially resolved Chronicity:  New Context: insect bite/sting   Context: not skin injury   Relieved by:  Nothing Exacerbated by: contact. Ineffective treatments:  Oral antibiotics Associated symptoms: no fatigue and no fever     Past Medical History:  Diagnosis Date  . Dental disease    having implants d/t tooth growing up into sinuses  . Heart valve disorder    "leaking"  . Hypertension     Patient Active Problem List   Diagnosis Date Noted  . TACHYCARDIA 06/22/2010  . HYPERTENSION 12/14/2006  . ECHOCARDIOGRAM, ABNORMAL 12/14/2006    Past Surgical History:  Procedure Laterality Date  . COLONOSCOPY      OB History    Gravida Para Term Preterm AB Living   0 0 0 0 0 0   SAB TAB Ectopic Multiple Live Births   0 0 0 0         Home Medications    Prior to Admission medications   Medication Sig Start Date End Date Taking? Authorizing Provider  doxycycline (VIBRAMYCIN) 100 MG capsule Take 1 capsule (100 mg total) by mouth 2 (two) times daily. Take with food. 06/20/17   Kandra Nicolas, MD    Family History Family History  Problem Relation Age of Onset  . Cancer Mother        bladder cancer  . Cancer Father        colon cancer  . Colon cancer Father 3  . Esophageal cancer Neg Hx   . Rectal cancer Neg Hx   . Stomach cancer Neg Hx     Social History Social History  Substance Use Topics  . Smoking status: Never Smoker  . Smokeless tobacco: Never Used  . Alcohol use No     Allergies   Augmentin [amoxicillin-pot clavulanate] and Clindamycin/lincomycin   Review of Systems Review of Systems  Constitutional: Negative for fatigue and fever.  All other systems reviewed and are negative.    Physical Exam Triage Vital Signs ED Triage Vitals [06/20/17 1330]  Enc Vitals Group     BP 135/90     Pulse Rate (!) 120     Resp 16     Temp 98.1 F (36.7 C)     Temp Source Oral     SpO2 100 %     Weight 125 lb (56.7 kg)     Height      Head Circumference      Peak Flow  Pain Score 0     Pain Loc      Pain Edu?      Excl. in [place]?    No data found.   Updated Vital Signs BP 135/90 (BP Location: Left Arm)   Pulse (!) 120   Temp 98.1 F (36.7 C) (Oral)   Resp 16   Wt 125 lb (56.7 kg)   LMP 08/29/2008   SpO2 100%   BMI 20.64 kg/m   Visual Acuity Right Eye Distance:   Left Eye Distance:   Bilateral Distance:    Right Eye Near:   Left Eye Near:    Bilateral Near:     Physical Exam  Constitutional: She appears well-developed and well-nourished. No distress.  HENT:  Head: Normocephalic.  Eyes: Pupils are equal, round, and reactive to light.  Cardiovascular:  Tachycardia  Pulmonary/Chest: Effort normal.  Neurological: She is alert.  Skin: Skin is warm and dry.     Right medial anterior thigh has 1.5cm fluctuant sebaceous cyst, tender to palpation.  Minimal erythema.  Nursing note and vitals reviewed.    UC Treatments / Results  Labs (all labs ordered are listed, but only abnormal results are displayed) Labs Reviewed  WOUND  CULTURE    EKG  EKG Interpretation None       Radiology No results found.  Procedures Procedures Incise and drain cyst/abscess Risks and benefits of procedure explained to patient and verbal consent obtained.  Using sterile technique, injected local anesthesia with 1% lidocaine with epinephrine, and cleansed affected area with Betadine and saline. Identified the most fluctuant area of lesion and incised with #11 blade.  Expressed blood and purulent material.  Inserted Iodoform gauze packing.  Bandage applied.  Patient tolerated well   Medications Ordered in UC Medications - No data to display   Initial Impression / Assessment and Plan / UC Course  I have reviewed the triage vital signs and the nursing notes.  Pertinent labs & imaging results that were available during my care of the patient were reviewed by me and considered in my medical decision making (see chart for details).    Wound culture pending.  Begin doxycycline 100mg  BID.  Keep wound clean and dry.  Leave bandage in place until follow-up visit tomorrow.  After packing removal tomorrow, change bandage daily until healed.    Final Clinical Impressions(s) / UC Diagnoses   Final diagnoses:  Infected sebaceous cyst    New Prescriptions New Prescriptions   DOXYCYCLINE (VIBRAMYCIN) 100 MG CAPSULE    Take 1 capsule (100 mg total) by mouth 2 (two) times daily. Take with food.         Kandra Nicolas, MD 06/20/17 [phone]

## 2017-06-20 NOTE — Discharge Instructions (Signed)
Keep wound clean and dry.  Leave bandage in place until follow-up visit tomorrow.  After packing removal tomorrow, change bandage daily until healed.

## 2017-06-21 ENCOUNTER — Emergency Department (INDEPENDENT_AMBULATORY_CARE_PROVIDER_SITE_OTHER)
Admission: EM | Admit: 2017-06-21 | Discharge: 2017-06-21 | Disposition: A | Discharge status: Home / Self Care | Payer: BLUE CROSS/BLUE SHIELD | Source: Home / Self Care | Attending: Family Medicine | Admitting: Family Medicine

## 2017-06-21 ENCOUNTER — Encounter: Payer: Self-pay | Admitting: *Deleted

## 2017-06-21 DIAGNOSIS — Z5189 Encounter for other specified aftercare: Secondary | ICD-10-CM

## 2017-06-21 NOTE — ED Triage Notes (Signed)
Melissa Russell is here today for a recheck of her RT upper inner thigh abscess.

## 2017-06-21 NOTE — Discharge Instructions (Signed)
°  You should continue to take the antibiotic, Doxycycline as prescribed until you complete the entire course of medication.  You may change the bandage 1-2 times daily. Gently clean area with warm water and gentle soap.  Pat dry area.  You may apply an over the counter antibiotic ointment for the next 2-3 days along with a clean dry bandage.   If you have any additional questions or concerns, do not hesitate to call our office or come back for a wound recheck.

## 2017-06-21 NOTE — ED Provider Notes (Signed)
Vitals:   06/21/17 1337  BP: (!) 143/84  Pulse: (!) 114  Resp: 16  Temp: 98.2 F (36.8 C)  SpO2: 100%   HR elevated- down from 120 yesterday. Pt reports hx of elevated HR. Denies chest pain, palpitations or SOB.   Melissa Russell is a 61 y.o. female presenting to UC presenting to UC for packing removal from infected subaceous cyst that was I&D yesterday at [facility]. She has been taking her doxycycline as prescribed. Pain, redness and swelling have improved since yesterday. Denies fever, n/v/d.   Right medial thigh: Dressing and packing removed w/o immediate complication. Wound cleaned with saline. No active drainage. Mild tenderness and 31mm are of erythema surrounding incision opening.    Wound appears to be healing well. Bacitracin and new dry bandage applied.  Continue taking doxycycline as prescribed Change bandage 1-2 times daily F/u with PCP or UC in 4-5 days if not improving, sooner if worsening.    NO CHARGE. Pt had I&D performed at this facility on 06/20/17.  Visit within 10 day global period.      [name], [place] 06/21/17 1404

## 2017-06-24 ENCOUNTER — Telehealth: Payer: Self-pay | Admitting: Emergency Medicine

## 2017-06-24 LAB — WOUND CULTURE
MICRO NUMBER: 81184276
SPECIMEN QUALITY: ADEQUATE

## 2017-06-24 NOTE — Telephone Encounter (Signed)
courtesy call to patient, states she seems to be doing better advised to call back with questions.  AP, CMA

## 2017-06-24 NOTE — Telephone Encounter (Signed)
Attempted to call patient with normal Wound Cx results, no answer, will try back at at later time.

## 2017-08-30 DIAGNOSIS — H04123 Dry eye syndrome of bilateral lacrimal glands: Secondary | ICD-10-CM | POA: Diagnosis not present

## 2017-08-30 DIAGNOSIS — H35353 Cystoid macular degeneration, bilateral: Secondary | ICD-10-CM | POA: Diagnosis not present

## 2017-08-30 DIAGNOSIS — H2513 Age-related nuclear cataract, bilateral: Secondary | ICD-10-CM | POA: Diagnosis not present

## 2017-10-16 ENCOUNTER — Other Ambulatory Visit: Payer: Self-pay | Admitting: Obstetrics and Gynecology

## 2017-10-16 DIAGNOSIS — Z1231 Encounter for screening mammogram for malignant neoplasm of breast: Secondary | ICD-10-CM

## 2017-11-14 ENCOUNTER — Ambulatory Visit
Admission: RE | Admit: 2017-11-14 | Discharge: 2017-11-14 | Disposition: A | Discharge status: Home / Self Care | Payer: BLUE CROSS/BLUE SHIELD | Source: Ambulatory Visit | Attending: Obstetrics and Gynecology | Admitting: Obstetrics and Gynecology

## 2017-11-14 DIAGNOSIS — Z1231 Encounter for screening mammogram for malignant neoplasm of breast: Secondary | ICD-10-CM | POA: Diagnosis not present

## 2018-01-19 ENCOUNTER — Emergency Department (INDEPENDENT_AMBULATORY_CARE_PROVIDER_SITE_OTHER): Payer: BLUE CROSS/BLUE SHIELD

## 2018-01-19 ENCOUNTER — Emergency Department
Admission: EM | Admit: 2018-01-19 | Discharge: 2018-01-19 | Disposition: A | Discharge status: Home / Self Care | Payer: BLUE CROSS/BLUE SHIELD | Source: Home / Self Care

## 2018-01-19 ENCOUNTER — Encounter: Payer: Self-pay | Admitting: *Deleted

## 2018-01-19 ENCOUNTER — Other Ambulatory Visit: Payer: Self-pay

## 2018-01-19 DIAGNOSIS — M25532 Pain in left wrist: Secondary | ICD-10-CM

## 2018-01-19 DIAGNOSIS — R030 Elevated blood-pressure reading, without diagnosis of hypertension: Secondary | ICD-10-CM

## 2018-01-19 DIAGNOSIS — M778 Other enthesopathies, not elsewhere classified: Secondary | ICD-10-CM

## 2018-01-19 NOTE — Discharge Instructions (Addendum)
Alternate heat and ice treatments for your wrist. Wear your splint. Do range of motion exercises. Follow-up with sports medicine if symptoms are persistent.

## 2018-01-19 NOTE — ED Triage Notes (Signed)
Patient c/o 2-3 months of left wrist pain that started after excessive computer use then later injured the left wrist catching herself when falling from a rolling chair. No previous injuries.

## 2018-01-19 NOTE — ED Provider Notes (Signed)
[facility]    CSN: [id number] Arrival date & time: 01/19/18  0930     History   Chief Complaint Chief Complaint  Patient presents with  . Wrist Pain    HPI Melissa Russell is a 62 y.o. female.  Patient enters with a history of left wrist pain for the last 2 to 3 months.  She states she had an injury where she fell earlier in the year with her wrist in extension and may have injured her wrist at that time.  She does a lot of work on the computer and is concerned this may be making her wrist problem worse.  She has worn a splint which does help some but has not resolved the problem. HPI  Past Medical History:  Diagnosis Date  . Dental disease    having implants d/t tooth growing up into sinuses  . Heart valve disorder    "leaking"  . Hypertension     Patient Active Problem List   Diagnosis Date Noted  . TACHYCARDIA 06/22/2010  . HYPERTENSION 12/14/2006  . ECHOCARDIOGRAM, ABNORMAL 12/14/2006    Past Surgical History:  Procedure Laterality Date  . COLONOSCOPY      OB History    Gravida  0   Para  0   Term  0   Preterm  0   AB  0   Living  0     SAB  0   TAB  0   Ectopic  0   Multiple  0   Live Births               Home Medications    Prior to Admission medications   Not on File    Family History Family History  Problem Relation Age of Onset  . Cancer Mother        bladder cancer  . Cancer Father        colon cancer  . Colon cancer Father 18  . Esophageal cancer Neg Hx   . Rectal cancer Neg Hx   . Stomach cancer Neg Hx     Social History Social History   Tobacco Use  . Smoking status: Never Smoker  . Smokeless tobacco: Never Used  Substance Use Topics  . Alcohol use: No    Alcohol/week: 0.0 oz  . Drug use: No     Allergies   Augmentin [amoxicillin-pot clavulanate] and Clindamycin/lincomycin   Review of Systems Review of Systems  Constitutional: Negative.   Musculoskeletal: Negative for back pain and  neck pain.       Pain and discomfort left wrist.     Physical Exam Triage Vital Signs ED Triage Vitals [01/19/18 1020]  Enc Vitals Group     BP (!) 175/100     Pulse Rate (!) 124     Resp      Temp      Temp src      SpO2 99 %     Weight 129 lb (58.5 kg)     Height      Head Circumference      Peak Flow      Pain Score 6     Pain Loc      Pain Edu?      Excl. in [place]?    No data found.  Updated Vital Signs BP (!) 172/93   Pulse (!) 124   Wt 129 lb (58.5 kg)   LMP 08/29/2008   SpO2 99%   BMI 21.30  kg/m   Visual Acuity Right Eye Distance:   Left Eye Distance:   Bilateral Distance:    Right Eye Near:   Left Eye Near:    Bilateral Near:     Physical Exam  Musculoskeletal:  There is swelling noted over the radial side of the left wrist.  There is some swelling which extends over the dorsum of the wrist radial side.  There is pain with flexion against resistance and radial deviation against resistance.  Neurovascular is otherwise intact.  There is no tenderness over the carpal tunnel.     UC Treatments / Results  Labs (all labs ordered are listed, but only abnormal results are displayed) Labs Reviewed - No data to display  EKG None  Radiology Dg Wrist Complete Left  Result Date: 01/19/2018 CLINICAL DATA:  Left wrist pain after fall several months ago. EXAM: LEFT WRIST - COMPLETE 3+ VIEW COMPARISON:  None. FINDINGS: There is no evidence of fracture or dislocation. There is no evidence of arthropathy or other focal bone abnormality. Soft tissues are unremarkable. IMPRESSION: Normal left wrist. Electronically Signed   By: Marijo Conception, M.D.   On: 01/19/2018 10:40    Procedures Procedures (including critical care time)  Medications Ordered in UC Medications - No data to display  Initial Impression / Assessment and Plan / UC Course  I have reviewed the triage vital signs and the nursing notes.  Pertinent labs & imaging results that were available during  my care of the patient were reviewed by me and considered in my medical decision making (see chart for details).     Patient presents with 2 to 31-month history of left wrist pain.  Her wrist films are normal.  She does not want to take an anti-inflammatory medication.  She is agreeable to use a wrist splint and if her symptoms persist she will follow-up with sports medicine.  Her blood pressure was elevated but she states she takes her blood pressure at home and it is much better.  I told her I was still very concerned about this and she should follow this up with her primary care physician. Final Clinical Impressions(s) / UC Diagnoses   Final diagnoses:  Left wrist pain  Tendinitis of left wrist  Elevated blood-pressure reading without diagnosis of hypertension     Discharge Instructions     Alternate heat and ice treatments for your wrist. Wear your splint. Do range of motion exercises. Follow-up with sports medicine if symptoms are persistent.    ED Prescriptions    None     Controlled Substance Prescriptions [place] Controlled Substance Registry consulted? Not Applicable   Darlyne Russian, MD 01/19/18 [phone]

## 2018-03-28 ENCOUNTER — Ambulatory Visit: Payer: BLUE CROSS/BLUE SHIELD | Admitting: Obstetrics and Gynecology

## 2018-04-10 NOTE — Progress Notes (Signed)
62 y.o. G98P0000 Married Caucasian female here for annual exam.    Back from dancing in [place].   States her BP is usually normal and can even be on the low side.  She checks it at home because she did have elevated BP at one point.   Labs with PCP.   PCP: [facility]   Patient's last menstrual period was 08/29/2008.           Sexually active: No.  The current method of family planning is post menopausal status.    Exercising: Yes.    dancing, walking Smoker:  no  Health Maintenance: Pap:  01/27/16 neg. HR HPV:neg   07/18/13 Neg. HR HPV:neg  History of abnormal Pap:  no MMG:  11/14/17 BIRADS1:Neg  Colonoscopy:  07/12/16 Normal. F/u 5 years  BMD:  Never  TDaP:  2017 HIV: done 2001 Hep C: done 2001 Screening Labs: PCP   reports that she has never smoked. She has never used smokeless tobacco. She reports that she does not drink alcohol or use drugs.  Past Medical History:  Diagnosis Date  . Dental disease    having implants d/t tooth growing up into sinuses  . Heart valve disorder    "leaking"  . Hypertension     Past Surgical History:  Procedure Laterality Date  . COLONOSCOPY      No current outpatient medications on file.   Current Facility-Administered Medications  Medication Dose Route Frequency Provider Last Rate Last Dose  . 0.9 %  sodium chloride infusion  500 mL Intravenous Continuous Gatha Mayer, MD        Family History  Problem Relation Age of Onset  . Cancer Mother        bladder cancer  . Cancer Father        colon cancer  . Colon cancer Father 13  . Esophageal cancer Neg Hx   . Rectal cancer Neg Hx   . Stomach cancer Neg Hx     Review of Systems  All other systems reviewed and are negative.   Exam:   BP (!) 160/86 (BP Location: Right Arm, Patient Position: Sitting, Cuff Size: Normal)   Pulse (!) 112   Resp 18   Ht 5' 5.75" (1.67 m)   Wt 126 lb 9.6 oz (57.4 kg)   LMP 08/29/2008   BMI 20.59 kg/m     General appearance: alert,  cooperative and appears stated age Head: Normocephalic, without obvious abnormality, atraumatic Neck: no adenopathy, supple, symmetrical, trachea midline and thyroid normal to inspection and palpation Lungs: clear to auscultation bilaterally Breasts: normal appearance, no masses or tenderness, No nipple retraction or dimpling, No nipple discharge or bleeding, No axillary or supraclavicular adenopathy Heart: regular rate and rhythm Abdomen: soft, non-tender; no masses, no organomegaly Extremities: extremities normal, atraumatic, no cyanosis or edema Skin: Skin color, texture, turgor normal. No rashes or lesions Lymph nodes: Cervical, supraclavicular, and axillary nodes normal. No abnormal inguinal nodes palpated Neurologic: Grossly normal  Pelvic: External genitalia:  no lesions              Urethra:  normal appearing urethra with no masses, tenderness or lesions              Bartholins and Skenes: normal                 Vagina:  Erythema of the vagina and orange discharge.              Cervix: no lesions  Pap taken: No. Bimanual Exam:  Uterus:  normal size, contour, position, consistency, mobility, non-tender              Adnexa: no mass, fullness, tenderness              Rectal exam: Yes.  .  Confirms.              Anus:  normal sphincter tone, no lesions  Chaperone was present for exam.  Assessment:   Well woman visit with normal exam. Elevated BP reading.  Vaginal atrophy.   Plan: Mammogram screening. Recommended self breast awareness. Pap and HR HPV as above. Guidelines for Calcium, Vitamin D, regular exercise program including cardiovascular and weight bearing exercise. Declines BMD at this time.  I gave her written information about it. Declines tx for vaginal atrophy.  Labs with PCP.  Repeat BP - 160/90. She will monitor this at home for persistent elevation.  Follow up annually and prn.   After visit summary provided.

## 2018-04-11 ENCOUNTER — Other Ambulatory Visit: Payer: Self-pay

## 2018-04-11 ENCOUNTER — Encounter: Payer: Self-pay | Admitting: Obstetrics and Gynecology

## 2018-04-11 ENCOUNTER — Ambulatory Visit: Payer: BLUE CROSS/BLUE SHIELD | Admitting: Obstetrics and Gynecology

## 2018-04-11 VITALS — BP 160/90 | HR 112 | Resp 18 | Ht 65.75 in | Wt 126.6 lb

## 2018-04-11 DIAGNOSIS — Z01419 Encounter for gynecological examination (general) (routine) without abnormal findings: Secondary | ICD-10-CM | POA: Diagnosis not present

## 2018-04-11 NOTE — Patient Instructions (Signed)
EXERCISE AND DIET:  We recommended that you start or continue a regular exercise program for good health. Regular exercise means any activity that makes your heart beat faster and makes you sweat.  We recommend exercising at least 30 minutes per day at least 3 days a week, preferably 4 or 5.  We also recommend a diet low in fat and sugar.  Inactivity, poor dietary choices and obesity can cause diabetes, heart attack, stroke, and kidney damage, among others.    ALCOHOL AND SMOKING:  Women should limit their alcohol intake to no more than 7 drinks/beers/glasses of wine (combined, not each!) per week. Moderation of alcohol intake to this level decreases your risk of breast cancer and liver damage. And of course, no recreational drugs are part of a healthy lifestyle.  And absolutely no smoking or even second hand smoke. Most people know smoking can cause heart and lung diseases, but did you know it also contributes to weakening of your bones? Aging of your skin?  Yellowing of your teeth and nails?  CALCIUM AND VITAMIN D:  Adequate intake of calcium and Vitamin D are recommended.  The recommendations for exact amounts of these supplements seem to change often, but generally speaking 600 mg of calcium (either carbonate or citrate) and 800 units of Vitamin D per day seems prudent. Certain women may benefit from higher intake of Vitamin D.  If you are among these women, your doctor will have told you during your visit.    PAP SMEARS:  Pap smears, to check for cervical cancer or precancers,  have traditionally been done yearly, although recent scientific advances have shown that most women can have pap smears less often.  However, every woman still should have a physical exam from her gynecologist every year. It will include a breast check, inspection of the vulva and vagina to check for abnormal growths or skin changes, a visual exam of the cervix, and then an exam to evaluate the size and shape of the uterus and  ovaries.  And after 64 years of age, a rectal exam is indicated to check for rectal cancers. We will also provide age appropriate advice regarding health maintenance, like when you should have certain vaccines, screening for sexually transmitted diseases, bone density testing, colonoscopy, mammograms, etc.   MAMMOGRAMS:  All women over 40 years old should have a yearly mammogram. Many facilities now offer a "3D" mammogram, which may cost around $50 extra out of pocket. If possible,  we recommend you accept the option to have the 3D mammogram performed.  It both reduces the number of women who will be called back for extra views which then turn out to be normal, and it is better than the routine mammogram at detecting truly abnormal areas.    COLONOSCOPY:  Colonoscopy to screen for colon cancer is recommended for all women at age 50.  We know, you hate the idea of the prep.  We agree, BUT, having colon cancer and not knowing it is worse!!  Colon cancer so often starts as a polyp that can be seen and removed at colonscopy, which can quite literally save your life!  And if your first colonoscopy is normal and you have no family history of colon cancer, most women don't have to have it again for 10 years.  Once every ten years, you can do something that may end up saving your life, right?  We will be happy to help you get it scheduled when you are ready.    Be sure to check your insurance coverage so you understand how much it will cost.  It may be covered as a preventative service at no cost, but you should check your particular policy.     sBone Densitometry Bone densitometry is an imaging test that uses a special X-ray to measure the amount of calcium and other minerals in your bones (bone density). This test is also known as a bone mineral density test or dual-energy X-ray absorptiometry (DXA). The test can measure bone density at your hip and your spine. It is similar to having a regular X-ray. You may have  this test to:  Diagnose a condition that causes weak or thin bones (osteoporosis).  Predict your risk of a broken bone (fracture).  Determine how well osteoporosis treatment is working.  Tell a health care provider about:  Any allergies you have.  All medicines you are taking, including vitamins, herbs, eye drops, creams, and over-the-counter medicines.  Any problems you or family members have had with anesthetic medicines.  Any blood disorders you have.  Any surgeries you have had.  Any medical conditions you have.  Possibility of pregnancy.  Any other medical test you had within the previous 14 days that used contrast material. What are the risks? Generally, this is a safe procedure. However, problems can occur and may include the following:  This test exposes you to a very small amount of radiation.  The risks of radiation exposure may be greater to unborn children.  What happens before the procedure?  Do not take any calcium supplements for 24 hours before having the test. You can otherwise eat and drink what you usually do.  Take off all metal jewelry, eyeglasses, dental appliances, and any other metal objects. What happens during the procedure?  You may lie on an exam table. There will be an X-ray generator below you and an imaging device above you.  Other devices, such as boxes or braces, may be used to position your body properly for the scan.  You will need to lie still while the machine slowly scans your body.  The images will show up on a computer monitor. What happens after the procedure? You may need more testing at a later time. This information is not intended to replace advice given to you by your health care provider. Make sure you discuss any questions you have with your health care provider. Document Released: 09/06/2004 Document Revised: 01/21/2016 Document Reviewed: 01/23/2014 Elsevier Interactive Patient Education  2018 [employer].

## 2018-06-05 DIAGNOSIS — H35063 Retinal vasculitis, bilateral: Secondary | ICD-10-CM | POA: Diagnosis not present

## 2018-06-05 DIAGNOSIS — H2512 Age-related nuclear cataract, left eye: Secondary | ICD-10-CM | POA: Diagnosis not present

## 2018-06-05 DIAGNOSIS — H2511 Age-related nuclear cataract, right eye: Secondary | ICD-10-CM | POA: Diagnosis not present

## 2018-06-05 DIAGNOSIS — H35352 Cystoid macular degeneration, left eye: Secondary | ICD-10-CM | POA: Diagnosis not present

## 2018-06-05 DIAGNOSIS — H35073 Retinal telangiectasis, bilateral: Secondary | ICD-10-CM | POA: Diagnosis not present

## 2018-07-12 ENCOUNTER — Encounter: Payer: Self-pay | Admitting: Emergency Medicine

## 2018-07-12 ENCOUNTER — Emergency Department (INDEPENDENT_AMBULATORY_CARE_PROVIDER_SITE_OTHER)
Admission: EM | Admit: 2018-07-12 | Discharge: 2018-07-12 | Disposition: A | Discharge status: Home / Self Care | Payer: BLUE CROSS/BLUE SHIELD | Source: Home / Self Care | Attending: Family Medicine | Admitting: Family Medicine

## 2018-07-12 ENCOUNTER — Other Ambulatory Visit: Payer: Self-pay

## 2018-07-12 DIAGNOSIS — H6121 Impacted cerumen, right ear: Secondary | ICD-10-CM

## 2018-07-12 DIAGNOSIS — H8110 Benign paroxysmal vertigo, unspecified ear: Secondary | ICD-10-CM

## 2018-07-12 MED ORDER — CARBAMIDE PEROXIDE 6.5 % OT SOLN: OTIC | 0 refills | Status: DC

## 2018-07-12 MED ORDER — MECLIZINE HCL 25 MG PO TABS: ORAL_TABLET | ORAL | 0 refills | Status: DC

## 2018-07-12 NOTE — ED Triage Notes (Addendum)
Near syncope, spinning in the head x 10 days Headache

## 2018-07-12 NOTE — ED Provider Notes (Signed)
[facility]    CSN: [id number] Arrival date & time: 07/12/18  1633     History   Chief Complaint Chief Complaint  Patient presents with  . Dizziness    HPI Melissa Russell is a 62 y.o. female.   Patient complains of intermittent dizziness ("off balance," "room spinning") for about one week.  She also complains of fullness in her right ear.  She feels well otherwise.  No fevers, chills, and sweats.  No recent URI or nasal congestion.  The history is provided by the patient.  Dizziness  Quality:  Imbalance and room spinning Severity:  Mild Onset quality:  Gradual Duration:  1 week Timing:  Intermittent Progression:  Worsening Chronicity:  New Context: bending over, head movement and standing up   Context: not with eye movement, not with loss of consciousness, not with medication and not when urinating   Relieved by:  None tried Worsened by:  Movement Ineffective treatments:  None tried Associated symptoms: headaches   Associated symptoms: no chest pain, no hearing loss, no nausea, no palpitations, no shortness of breath, no syncope, no tinnitus, no vision changes, no vomiting and no weakness   Risk factors: no anemia, no heart disease, no hx of stroke, no hx of vertigo, no Meniere's disease and no new medications     Past Medical History:  Diagnosis Date  . Dental disease    having implants d/t tooth growing up into sinuses  . Heart valve disorder    "leaking"  . Hypertension     Patient Active Problem List   Diagnosis Date Noted  . TACHYCARDIA 06/22/2010  . HYPERTENSION 12/14/2006  . ECHOCARDIOGRAM, ABNORMAL 12/14/2006    Past Surgical History:  Procedure Laterality Date  . COLONOSCOPY      OB History    Gravida  0   Para  0   Term  0   Preterm  0   AB  0   Living  0     SAB  0   TAB  0   Ectopic  0   Multiple  0   Live Births               Home Medications    Prior to Admission medications   Medication Sig Start  Date End Date Taking? Authorizing Provider  carbamide peroxide (DEBROX) 6.5 % OTIC solution Place 5 drops into the right 2 times daily for 4 days. 07/12/18   Kandra Nicolas, MD  meclizine (ANTIVERT) 25 MG tablet Take one tab by mouth 2 or 3 times daily as needed for dizziness. 07/12/18   Kandra Nicolas, MD    Family History Family History  Problem Relation Age of Onset  . Cancer Mother        bladder cancer  . Cancer Father        colon cancer  . Colon cancer Father 55  . Esophageal cancer Neg Hx   . Rectal cancer Neg Hx   . Stomach cancer Neg Hx     Social History Social History   Tobacco Use  . Smoking status: Never Smoker  . Smokeless tobacco: Never Used  Substance Use Topics  . Alcohol use: No    Alcohol/week: 0.0 standard drinks  . Drug use: No     Allergies   Augmentin [amoxicillin-pot clavulanate] and Clindamycin/lincomycin   Review of Systems Review of Systems  HENT: Negative for hearing loss and tinnitus.   Respiratory: Negative for shortness of breath.  Cardiovascular: Negative for chest pain, palpitations and syncope.  Gastrointestinal: Negative for nausea and vomiting.  Neurological: Positive for dizziness and headaches. Negative for weakness.  All other systems reviewed and are negative.    Physical Exam Triage Vital Signs ED Triage Vitals  Enc Vitals Group     BP 07/12/18 1653 (!) 178/94     Pulse Rate 07/12/18 1653 (!) 120     Resp --      Temp 07/12/18 1653 97.8 F (36.6 C)     Temp Source 07/12/18 1653 Oral     SpO2 07/12/18 1653 100 %     Weight 07/12/18 1654 125 lb (56.7 kg)     Height 07/12/18 1654 5\' 5"  (1.651 m)     Head Circumference --      Peak Flow --      Pain Score 07/12/18 1654 2     Pain Loc --      Pain Edu? --      Excl. in [place]? --    Orthostatic VS for the past 24 hrs:  BP- Lying Pulse- Lying BP- Sitting Pulse- Sitting BP- Standing at 0 minutes Pulse- Standing at 0 minutes  07/12/18 1708 165/90 107 (!) 175/99 114  (!) 163/100 114    Updated Vital Signs BP (!) 178/94 (BP Location: Right Arm)   Pulse (!) 120   Temp 97.8 F (36.6 C) (Oral)   Ht 5\' 5"  (1.651 m)   Wt 56.7 kg   LMP 08/29/2008   SpO2 100%   BMI 20.80 kg/m   Visual Acuity Right Eye Distance:   Left Eye Distance:   Bilateral Distance:    Right Eye Near:   Left Eye Near:    Bilateral Near:     Physical Exam Nursing notes and Vital Signs reviewed. Appearance:  Patient appears stated age, and in no acute distress Eyes:  Pupils are equal, round, and reactive to light and accomodation.  Extraocular movement is intact.  Conjunctivae are not inflamed.  Fundi benign.  No definite nystagmus noted.  Ears:   Right canal occluded with cerumen; unable to clear with lavage.  Left canal and tympanic membrane normal. Nose:   Normal turbinates.  No sinus tenderness.   Pharynx:  Normal Neck:  Supple. No adenopathy or thyromegaly.  Carotids have normal upstrokes without bruits. Lungs:  Clear to auscultation.  Breath sounds are equal.  Moving air well. Heart:  Regular rate and rhythm without murmurs, rubs, or gallops.  Abdomen:  Nontender without masses or hepatosplenomegaly.  Bowel sounds are present.  No CVA or flank tenderness.  Extremities:  No edema.  Skin:  No rash present.   Neurologic:  Cranial nerves 2 through 12 are normal.  Patellar, achilles, and elbow reflexes are normal.  Cerebellar function is intact (finger-to-nose and rapid alternating hand movement).  Gait and station are normal.  Grip strength symmetric bilaterally.  Romberg negative.   UC Treatments / Results  Labs (all labs ordered are listed, but only abnormal results are displayed) Labs Reviewed - No data to display  EKG None  Radiology No results found.  Procedures Procedures  Attempted right ear lavage by nurse  Medications Ordered in UC Medications - No data to display  Initial Impression / Assessment and Plan / UC Course  I have reviewed the triage vital  signs and the nursing notes.  Pertinent labs & imaging results that were available during my care of the patient were reviewed by me and considered in my  medical decision making (see chart for details).    Normal neurologic exam reassuring. Rx for Debrox drops; follow-up with ENT (or return for repeat ear lavage) Rx for Antivert.  Final Clinical Impressions(s) / UC Diagnoses   Final diagnoses:  Benign paroxysmal positional vertigo, unspecified laterality  Impacted cerumen of right ear     Discharge Instructions      To prevent recurrent ear wax blockage, try the following: Soak two cotton balls with mineral oil, and gently place in each ear canal once weekly.  Leave the cotton balls in place for 10 to 20 minutes.  This will help liquefy the ear wax and aid your body's normal elimination process.  If applicable, do not use a hearing aid for 8 hours overnight.  Have your ears cleaned by a health professional every 6 to 12 months.  Avoid using "Q-tips" and ear wax softening solutions     ED Prescriptions    Medication Sig Dispense Auth. Provider   meclizine (ANTIVERT) 25 MG tablet Take one tab by mouth 2 or 3 times daily as needed for dizziness. 15 tablet Kandra Nicolas, MD   carbamide peroxide (DEBROX) 6.5 % OTIC solution Place 5 drops into the right 2 times daily for 4 days. 15 mL Kandra Nicolas, MD        Kandra Nicolas, MD 07/14/18 [id number]

## 2018-07-12 NOTE — Discharge Instructions (Addendum)
°  To prevent recurrent ear wax blockage, try the following: Soak two cotton balls with mineral oil, and gently place in each ear canal once weekly.  Leave the cotton balls in place for 10 to 20 minutes.  This will help liquefy the ear wax and aid your body's normal elimination process.  If applicable, do not use a hearing aid for 8 hours overnight.  Have your ears cleaned by a health professional every 6 to 12 months.  Avoid using "Q-tips" and ear wax softening solutions

## 2018-08-06 ENCOUNTER — Ambulatory Visit (INDEPENDENT_AMBULATORY_CARE_PROVIDER_SITE_OTHER): Payer: BLUE CROSS/BLUE SHIELD | Admitting: Otolaryngology

## 2018-08-06 DIAGNOSIS — H9011 Conductive hearing loss, unilateral, right ear, with unrestricted hearing on the contralateral side: Secondary | ICD-10-CM

## 2018-08-06 DIAGNOSIS — H6121 Impacted cerumen, right ear: Secondary | ICD-10-CM

## 2018-08-13 DIAGNOSIS — L650 Telogen effluvium: Secondary | ICD-10-CM | POA: Diagnosis not present

## 2018-11-07 ENCOUNTER — Other Ambulatory Visit: Payer: Self-pay | Admitting: Obstetrics and Gynecology

## 2018-11-07 DIAGNOSIS — Z1231 Encounter for screening mammogram for malignant neoplasm of breast: Secondary | ICD-10-CM

## 2018-12-11 ENCOUNTER — Ambulatory Visit: Payer: BLUE CROSS/BLUE SHIELD

## 2019-02-04 ENCOUNTER — Ambulatory Visit
Admission: RE | Admit: 2019-02-04 | Discharge: 2019-02-04 | Disposition: A | Discharge status: Home / Self Care | Payer: Medicare Other | Source: Ambulatory Visit | Attending: Obstetrics and Gynecology | Admitting: Obstetrics and Gynecology

## 2019-02-04 ENCOUNTER — Other Ambulatory Visit: Payer: Self-pay

## 2019-02-04 DIAGNOSIS — Z1231 Encounter for screening mammogram for malignant neoplasm of breast: Secondary | ICD-10-CM

## 2019-04-22 ENCOUNTER — Other Ambulatory Visit: Payer: Self-pay

## 2019-04-22 NOTE — Progress Notes (Signed)
63 y.o. G74P0000 Married Caucasian female here for annual exam.    Had low iron last year with her PCP.  She did follow up last month.  She is still taking iron.  Cholesterol good level per patient.   No real hot flashes.  BP at home 115-126/69-75.   Doing virtual dancing.   PCP: [facility]    Patient's last menstrual period was 08/29/2008.           Sexually active: No.  The current method of family planning is post menopausal status.    Exercising: Yes.    walks daily. Smoker:  no  Health Maintenance: Pap: 01/27/16 neg. HR HPV:neg              07/18/13 Neg. HR HPV:neg  History of abnormal Pap:  no MMG: 02-04-19 Neg/density D/BiRads1 Colonoscopy:  07/12/16 Normal. F/u 5 years  BMD:   Never  Result  n/a TDaP:  2017 Gardasil:   N/A HIV:Neg 2001 Hep C:Neg 2001 Screening Labs:  ---- PCP.    reports that she has never smoked. She has never used smokeless tobacco. She reports that she does not drink alcohol or use drugs.  Past Medical History:  Diagnosis Date  . Dental disease    having implants d/t tooth growing up into sinuses  . Heart valve disorder    "leaking"  . Hypertension     Past Surgical History:  Procedure Laterality Date  . COLONOSCOPY      Current Outpatient Medications  Medication Sig Dispense Refill  . Ferrous Sulfate (IRON PO) Take 1 tablet by mouth daily.     No current facility-administered medications for this visit.     Family History  Problem Relation Age of Onset  . Cancer Mother        bladder cancer  . Cancer Father        colon cancer  . Colon cancer Father 49  . Esophageal cancer Neg Hx   . Rectal cancer Neg Hx   . Stomach cancer Neg Hx     Review of Systems  All other systems reviewed and are negative.   Exam:   BP (!) 150/86   Pulse (!) 120   Temp (!) 97.2 F (36.2 C) (Temporal)   Resp 12   Ht 5\' 6"  (1.676 m)   Wt 130 lb (59 kg)   LMP 08/29/2008   BMI 20.98 kg/m     General appearance: alert,  cooperative and appears stated age Head: normocephalic, without obvious abnormality, atraumatic Neck: no adenopathy, supple, symmetrical, trachea midline and thyroid normal to inspection and palpation Lungs: clear to auscultation bilaterally Breasts: normal appearance, no masses or tenderness, No nipple retraction or dimpling, No nipple discharge or bleeding, No axillary adenopathy Heart: regular rate and rhythm Abdomen: soft, non-tender; no masses, no organomegaly Extremities: extremities normal, atraumatic, no cyanosis or edema Skin: skin color, texture, turgor normal. No rashes or lesions Lymph nodes: cervical, supraclavicular, and axillary nodes normal. Neurologic: grossly normal  Pelvic: External genitalia:  no lesions              No abnormal inguinal nodes palpated.              Urethra:  normal appearing urethra with no masses, tenderness or lesions              Bartholins and Skenes: normal                 Vagina:  Erythema of  mucosa and orange discharge.              Cervix: no lesions              Pap taken: Yes. Bimanual Exam:  Uterus:  normal size, contour, position, consistency, mobility, non-tender              Adnexa: no mass, fullness, tenderness              Rectal exam: Yes.  .  Confirms.              Anus:  normal sphincter tone, no lesions  Chaperone was present for exam.  Assessment:   Well woman visit with normal exam. Estrogen deficiency, asymptomatic.  Elevated blood pressure.  White coat syndrome. Atrophy noted. Not SA.   Plan: Mammogram screening discussed. Self breast awareness reviewed. Pap and HR HPV as above. Guidelines for Calcium, Vitamin D, regular exercise program including cardiovascular and weight bearing exercise. Labs with PCP.  BMD during the next year.  She will plan to do with her next mammogram June 2021. Follow up annually and prn.   After visit summary provided.

## 2019-04-24 ENCOUNTER — Other Ambulatory Visit: Payer: Self-pay

## 2019-04-24 ENCOUNTER — Other Ambulatory Visit (HOSPITAL_COMMUNITY)
Admission: RE | Admit: 2019-04-24 | Discharge: 2019-04-24 | Disposition: A | Discharge status: Home / Self Care | Payer: Medicare Other | Source: Ambulatory Visit | Attending: Obstetrics and Gynecology | Admitting: Obstetrics and Gynecology

## 2019-04-24 ENCOUNTER — Encounter: Payer: Self-pay | Admitting: Obstetrics and Gynecology

## 2019-04-24 ENCOUNTER — Ambulatory Visit (INDEPENDENT_AMBULATORY_CARE_PROVIDER_SITE_OTHER): Payer: Medicare Other | Admitting: Obstetrics and Gynecology

## 2019-04-24 VITALS — BP 150/86 | HR 120 | Temp 97.2°F | Resp 12 | Ht 66.0 in | Wt 130.0 lb

## 2019-04-24 DIAGNOSIS — Z01419 Encounter for gynecological examination (general) (routine) without abnormal findings: Secondary | ICD-10-CM | POA: Diagnosis present

## 2019-04-24 DIAGNOSIS — Z124 Encounter for screening for malignant neoplasm of cervix: Secondary | ICD-10-CM | POA: Diagnosis not present

## 2019-04-24 DIAGNOSIS — Z780 Asymptomatic menopausal state: Secondary | ICD-10-CM | POA: Diagnosis not present

## 2019-04-24 NOTE — Patient Instructions (Signed)

## 2019-04-25 LAB — CYTOLOGY - PAP: Diagnosis: NEGATIVE

## 2019-09-09 ENCOUNTER — Ambulatory Visit: Payer: Medicare Other | Attending: Internal Medicine

## 2019-09-09 DIAGNOSIS — Z23 Encounter for immunization: Secondary | ICD-10-CM | POA: Insufficient documentation

## 2019-09-09 NOTE — Progress Notes (Signed)
   Covid-19 Vaccination Clinic  Name:  Melissa Russell    MRN: [id number] DOB: 07-29-1956  09/09/2019  Melissa Russell was observed post Covid-19 immunization for 15 minutes without incidence. She was provided with Vaccine Information Sheet and instruction to access the V-Safe system.   Melissa Russell was instructed to call 911 with any severe reactions post vaccine: Marland Kitchen Difficulty breathing  . Swelling of your face and throat  . A fast heartbeat  . A bad rash all over your body  . Dizziness and weakness    Immunizations Administered    Name Date Dose VIS Date Route   Pfizer COVID-19 Vaccine 09/09/2019 11:21 AM 0.3 mL 08/09/2019 Intramuscular   Manufacturer: [place]   Lot: [id number]   [place]: [id number]

## 2019-09-28 ENCOUNTER — Ambulatory Visit: Payer: Medicare Other | Attending: Internal Medicine

## 2019-09-28 ENCOUNTER — Ambulatory Visit: Payer: Medicare Other

## 2019-09-28 DIAGNOSIS — Z23 Encounter for immunization: Secondary | ICD-10-CM | POA: Insufficient documentation

## 2019-09-28 NOTE — Progress Notes (Signed)
   Covid-19 Vaccination Clinic  Name:  Melissa Russell    MRN: [id number] DOB: Apr 30, 1956  09/28/2019  [name] was observed post Covid-19 immunization for 15 minutes without incidence. She was provided with Vaccine Information Sheet and instruction to access the V-Safe system.   [name] was instructed to call 911 with any severe reactions post vaccine: Marland Kitchen Difficulty breathing  . Swelling of your face and throat  . A fast heartbeat  . A bad rash all over your body  . Dizziness and weakness    Immunizations Administered    Name Date Dose VIS Date Route   Pfizer COVID-19 Vaccine 09/28/2019  1:42 PM 0.3 mL 08/09/2019 Intramuscular   Manufacturer: [address]   Lot: [id number]   [place]: [id number]

## 2020-01-15 ENCOUNTER — Other Ambulatory Visit: Payer: Self-pay | Admitting: Obstetrics and Gynecology

## 2020-01-15 DIAGNOSIS — Z1231 Encounter for screening mammogram for malignant neoplasm of breast: Secondary | ICD-10-CM

## 2020-02-05 ENCOUNTER — Ambulatory Visit
Admission: RE | Admit: 2020-02-05 | Discharge: 2020-02-05 | Disposition: A | Discharge status: Home / Self Care | Payer: Medicare Other | Source: Ambulatory Visit | Attending: Obstetrics and Gynecology | Admitting: Obstetrics and Gynecology

## 2020-02-05 ENCOUNTER — Other Ambulatory Visit: Payer: Self-pay

## 2020-02-05 DIAGNOSIS — Z1231 Encounter for screening mammogram for malignant neoplasm of breast: Secondary | ICD-10-CM

## 2020-02-19 ENCOUNTER — Ambulatory Visit: Payer: Medicare Other | Admitting: Family Medicine

## 2020-02-20 ENCOUNTER — Ambulatory Visit (INDEPENDENT_AMBULATORY_CARE_PROVIDER_SITE_OTHER): Payer: Medicare Other | Admitting: Family Medicine

## 2020-02-20 ENCOUNTER — Other Ambulatory Visit: Payer: Self-pay

## 2020-02-20 ENCOUNTER — Encounter: Payer: Self-pay | Admitting: Family Medicine

## 2020-02-20 VITALS — BP 190/94 | HR 120 | Temp 98.2°F | Ht 66.14 in | Wt 127.2 lb

## 2020-02-20 DIAGNOSIS — H6121 Impacted cerumen, right ear: Secondary | ICD-10-CM | POA: Diagnosis not present

## 2020-02-20 DIAGNOSIS — R000 Tachycardia, unspecified: Secondary | ICD-10-CM | POA: Diagnosis not present

## 2020-02-20 DIAGNOSIS — Z13220 Encounter for screening for lipoid disorders: Secondary | ICD-10-CM | POA: Diagnosis not present

## 2020-02-20 DIAGNOSIS — R030 Elevated blood-pressure reading, without diagnosis of hypertension: Secondary | ICD-10-CM

## 2020-02-20 DIAGNOSIS — Z862 Personal history of diseases of the blood and blood-forming organs and certain disorders involving the immune mechanism: Secondary | ICD-10-CM | POA: Diagnosis not present

## 2020-02-20 NOTE — Assessment & Plan Note (Signed)
Lavage attempted today however unsuccessful.  She will follow up with ENT.

## 2020-02-20 NOTE — Assessment & Plan Note (Addendum)
BP readings better controlled at home per patient. She will return in about 4-6 weeks for BP check and will bring home cuff for comparison.   Labs ordered today.

## 2020-02-20 NOTE — Assessment & Plan Note (Signed)
Check cbc and ferritin 

## 2020-02-20 NOTE — Progress Notes (Signed)
Melissa Russell - 64 y.o. female MRN [id number]  Date of birth: 11-09-1955  Subjective Chief Complaint  Patient presents with  . Establish Care    HPI Melissa Russell is a 64 y.o. female with history of elevated blood pressure and iron deficiency here today for initial visit.  Reports that BP is often elevated in office but better controlled at home.  She denies any symptoms related to HTN including chest pain, shortness of breath, palpitations, headache or vision changes.    She has had some mild intermittent back pain.  Pain is non-radiating.  Tends to be worse with sitting and feels like lower back is swollen when she has flares of pain.    ROS:  A comprehensive ROS was completed and negative except as noted per HPI  Allergies  Allergen Reactions  . Augmentin [Amoxicillin-Pot Clavulanate]     Causes thrush  . Clindamycin/Lincomycin     Past Medical History:  Diagnosis Date  . Dental disease    having implants d/t tooth growing up into sinuses  . Heart valve disorder    "leaking"  . Hypertension     Past Surgical History:  Procedure Laterality Date  . COLONOSCOPY      Social History   Socioeconomic History  . Marital status: Married    Spouse name: Not on file  . Number of children: Not on file  . Years of education: Not on file  . Highest education level: Not on file  Occupational History  . Not on file  Tobacco Use  . Smoking status: Never Smoker  . Smokeless tobacco: Never Used  Vaping Use  . Vaping Use: Never used  Substance and Sexual Activity  . Alcohol use: No    Alcohol/week: 0.0 standard drinks  . Drug use: No  . Sexual activity: Not Currently    Partners: Male    Birth control/protection: Post-menopausal  Other Topics Concern  . Not on file  Social History Narrative  . Not on file   Social Determinants of Health   Financial Resource Strain:   . Difficulty of Paying Living Expenses:   Food Insecurity:   . Worried About Charity fundraiser in  the Last Year:   . Arboriculturist in the Last Year:   Transportation Needs:   . Film/video editor (Medical):   Marland Kitchen Lack of Transportation (Non-Medical):   Physical Activity:   . Days of Exercise per Week:   . Minutes of Exercise per Session:   Stress:   . Feeling of Stress :   Social Connections:   . Frequency of Communication with Friends and Family:   . Frequency of Social Gatherings with Friends and Family:   . Attends Religious Services:   . Active Member of Clubs or Organizations:   . Attends Archivist Meetings:   Marland Kitchen Marital Status:     Family History  Problem Relation Age of Onset  . Cancer Mother        bladder cancer  . Cancer Father        colon cancer  . Colon cancer Father 24  . Esophageal cancer Neg Hx   . Rectal cancer Neg Hx   . Stomach cancer Neg Hx     Health Maintenance  Topic Date Due  . Hepatitis C Screening  Never done  . DEXA SCAN  Never done  . PNA vac Low Risk Adult (1 of 2 - PCV13) Never done  . INFLUENZA VACCINE  03/29/2020  .  COLONOSCOPY  07/12/2021  . MAMMOGRAM  02/04/2022  . TETANUS/TDAP  04/18/2026  . COVID-19 Vaccine  Completed     ----------------------------------------------------------------------------------------------------------------------------------------------------------------------------------------------------------------- Physical Exam BP (!) 190/94 (BP Location: Left Arm, Patient Position: Sitting, Cuff Size: Small)   Pulse (!) 120   Temp 98.2 F (36.8 C) (Oral)   Ht 5' 6.14" (1.68 m)   Wt 127 lb 3.2 oz (57.7 kg)   LMP 08/29/2008   SpO2 97%   BMI 20.44 kg/m   Physical Exam Constitutional:      Appearance: Normal appearance.  HENT:     Head: Normocephalic and atraumatic.  Eyes:     General: No scleral icterus. Cardiovascular:     Rate and Rhythm: Normal rate and regular rhythm.  Pulmonary:     Effort: Pulmonary effort is normal.     Breath sounds: Normal breath sounds.  Musculoskeletal:      Cervical back: Neck supple.  Skin:    General: Skin is warm and dry.  Neurological:     General: No focal deficit present.     Mental Status: She is alert.  Psychiatric:        Mood and Affect: Mood normal.        Behavior: Behavior normal.     ------------------------------------------------------------------------------------------------------------------------------------------------------------------------------------------------------------------- Assessment and Plan  Elevated blood pressure reading in office with white coat syndrome, without diagnosis of hypertension BP readings better controlled at home per patient. She will return in about 4-6 weeks for BP check and will bring home cuff for comparison.   Labs ordered today.   Impacted cerumen of right ear Lavage attempted today however unsuccessful.  She will follow up with ENT.  History of iron deficiency anemia Check cbc and ferritin.    No orders of the defined types were placed in this encounter.   No follow-ups on file.    This visit occurred during the SARS-CoV-2 public health emergency.  Safety protocols were in place, including screening questions prior to the visit, additional usage of staff PPE, and extensive cleaning of exam room while observing appropriate contact time as indicated for disinfecting solutions.

## 2020-02-20 NOTE — Patient Instructions (Signed)
Very nice to meet you today! Please have labs completed, we'll be in touch with results and recommendations.. Please return in about 3-4 weeks for BP recheck.  Bring your home blood pressure cuff to compare to readings here in the office.  Let me know if the back continues to cause you issues.

## 2020-02-21 LAB — CBC
HCT: 43 % (ref 35.0–45.0)
Hemoglobin: 14.6 g/dL (ref 11.7–15.5)
MCH: 29 pg (ref 27.0–33.0)
MCHC: 34 g/dL (ref 32.0–36.0)
MCV: 85.3 fL (ref 80.0–100.0)
MPV: 9.3 fL (ref 7.5–12.5)
Platelets: 229 10*3/uL (ref 140–400)
RBC: 5.04 10*6/uL (ref 3.80–5.10)
RDW: 13 % (ref 11.0–15.0)
WBC: 5.2 10*3/uL (ref 3.8–10.8)

## 2020-02-21 LAB — LIPID PANEL
Cholesterol: 196 mg/dL (ref <200)
HDL: 77 mg/dL (ref >=50)
LDL Cholesterol (Calc): 100 mg/dL (calc) — ABNORMAL HIGH
Non-HDL Cholesterol (Calc): 119 mg/dL (calc) (ref <130)
Total CHOL/HDL Ratio: 2.5 (calc) (ref <5.0)
Triglycerides: 92 mg/dL (ref <150)

## 2020-02-21 LAB — COMPLETE METABOLIC PANEL WITH GFR
AG Ratio: 1.8 (calc) (ref 1.0–2.5)
ALT: 11 U/L (ref 6–29)
AST: 16 U/L (ref 10–35)
Albumin: 4.5 g/dL (ref 3.6–5.1)
Alkaline phosphatase (APISO): 76 U/L (ref 37–153)
BUN: 9 mg/dL (ref 7–25)
CO2: 26 mmol/L (ref 20–32)
Calcium: 10 mg/dL (ref 8.6–10.4)
Chloride: 103 mmol/L (ref 98–110)
Creat: 0.69 mg/dL (ref 0.50–0.99)
GFR, Est African American: 105 mL/min/{1.73_m2} (ref >=60)
GFR, Est Non African American: 91 mL/min/{1.73_m2} (ref >=60)
Globulin: 2.5 g/dL (calc) (ref 1.9–3.7)
Glucose, Bld: 112 mg/dL — ABNORMAL HIGH (ref 65–99)
Potassium: 4.5 mmol/L (ref 3.5–5.3)
Sodium: 139 mmol/L (ref 135–146)
Total Bilirubin: 0.7 mg/dL (ref 0.2–1.2)
Total Protein: 7 g/dL (ref 6.1–8.1)

## 2020-02-21 LAB — FERRITIN: Ferritin: 56 ng/mL (ref 16–288)

## 2020-02-21 LAB — TSH: TSH: 2.96 mIU/L (ref 0.40–4.50)

## 2020-02-26 ENCOUNTER — Encounter: Payer: Self-pay | Admitting: Family Medicine

## 2020-03-12 ENCOUNTER — Ambulatory Visit (INDEPENDENT_AMBULATORY_CARE_PROVIDER_SITE_OTHER): Payer: Medicare Other | Admitting: Family Medicine

## 2020-03-12 ENCOUNTER — Other Ambulatory Visit: Payer: Self-pay

## 2020-03-12 VITALS — BP 176/84 | HR 123 | Ht 66.0 in | Wt 127.0 lb

## 2020-03-12 DIAGNOSIS — R030 Elevated blood-pressure reading, without diagnosis of hypertension: Secondary | ICD-10-CM

## 2020-03-12 NOTE — Progress Notes (Signed)
Pt is here for BP check and comparison with home equipment readings. Pt brought home BP cuff to appt.   Initial BP: PCK: 176/84; 123bpm Pt: 196/97; 111bpm  2nd Reading (after 15 mins): PCK: 149/86; 112bpm Pt: 170/81; 113bpm  3rd Reading (after 15 mins): PCK: 169/84; 97bpm Pt: 179/90; 102bpm  Pt brought BP history from home x 1 month recorded. BP within 112/78 - 138/93 range.   Pt to return for Nurse visit in 6 weeks with recordings and add pulse to records.

## 2020-03-12 NOTE — Progress Notes (Signed)
Numbers are all over the place.  Home BP look overall OK and her BP machine reading higher than ours.  Still tachycardic.  She would likely benefit from a low dose betablocker.  Continue to monitor BP and return in 6 weeks.    Beatrice Lecher, MD

## 2020-04-23 ENCOUNTER — Encounter: Payer: Self-pay | Admitting: Family Medicine

## 2020-04-23 ENCOUNTER — Ambulatory Visit (INDEPENDENT_AMBULATORY_CARE_PROVIDER_SITE_OTHER): Payer: Medicare Other | Admitting: Family Medicine

## 2020-04-23 VITALS — BP 160/94 | HR 122

## 2020-04-23 DIAGNOSIS — R030 Elevated blood-pressure reading, without diagnosis of hypertension: Secondary | ICD-10-CM | POA: Diagnosis not present

## 2020-04-23 NOTE — Progress Notes (Signed)
Patient presented in office for Bp check. Blood pressure reading was abnormal, Blood pressure reading was recollected. Per Dr. Rodena Piety patient is to continue to monitor blood pressure at home.

## 2020-04-23 NOTE — Progress Notes (Signed)
patient seems to have "white coat" hypertension as her readings at home are excellent.  She will continue to monitor readings at home.

## 2020-06-02 ENCOUNTER — Ambulatory Visit (INDEPENDENT_AMBULATORY_CARE_PROVIDER_SITE_OTHER): Payer: Medicare Other | Admitting: Ophthalmology

## 2020-06-02 ENCOUNTER — Encounter (INDEPENDENT_AMBULATORY_CARE_PROVIDER_SITE_OTHER): Payer: Self-pay | Admitting: Ophthalmology

## 2020-06-02 ENCOUNTER — Other Ambulatory Visit: Payer: Self-pay

## 2020-06-02 DIAGNOSIS — H35063 Retinal vasculitis, bilateral: Secondary | ICD-10-CM | POA: Diagnosis not present

## 2020-06-02 DIAGNOSIS — H43813 Vitreous degeneration, bilateral: Secondary | ICD-10-CM

## 2020-06-02 DIAGNOSIS — H35351 Cystoid macular degeneration, right eye: Secondary | ICD-10-CM

## 2020-06-02 DIAGNOSIS — H35352 Cystoid macular degeneration, left eye: Secondary | ICD-10-CM | POA: Diagnosis not present

## 2020-06-02 DIAGNOSIS — H2511 Age-related nuclear cataract, right eye: Secondary | ICD-10-CM | POA: Insufficient documentation

## 2020-06-02 DIAGNOSIS — H2513 Age-related nuclear cataract, bilateral: Secondary | ICD-10-CM

## 2020-06-02 NOTE — Assessment & Plan Note (Signed)
History of CME OS in the past, 2016. Resolved at that time with systemic p.o. NSAIDs.  no topical or oral medications for treatment, resolved

## 2020-06-02 NOTE — Assessment & Plan Note (Signed)
The nature of cataract was discussed with the patient as well as the elective nature of surgery. The patient was reassured that surgery at a later date does not put the patient at risk for a worse outcome. It was emphasized that the need for surgery is dictated by the patient's quality of life as influenced by the cataract. Patient was instructed to maintain close follow up with their general eye care doctor.  Patient is scheduled to follow-up with Dr. Richardson Chiquito, March 2021.  Each eye has substantial color change to the lens as well as some central nuclear sclerosis cloudiness.

## 2020-06-02 NOTE — Progress Notes (Signed)
06/02/2020     CHIEF COMPLAINT Patient presents for Retina Follow Up   HISTORY OF PRESENT ILLNESS: Melissa Russell is a 64 y.o. female who presents to the clinic today for:   HPI    Retina Follow Up    Patient presents with  Other.  In both eyes.  This started 2 years ago.  Severity is mild.  Duration of 2 years.  Since onset it is stable.          Comments    2 Year F/U OU  Pt denies noticeable changes to [place] OU since last visit. Pt denies ocular pain, flashes of light, or floaters OU.         Last edited by Rockie Neighbours, [place] on 06/02/2020  1:06 PM. (History)      Referring physician: No referring provider defined for this encounter.  HISTORICAL INFORMATION:   Selected notes from the MEDICAL RECORD NUMBER       CURRENT MEDICATIONS: No current outpatient medications on file. (Ophthalmic Drugs)   No current facility-administered medications for this visit. (Ophthalmic Drugs)   Current Outpatient Medications (Other)  Medication Sig  . Ferrous Sulfate (IRON PO) Take 1 tablet by mouth daily.   No current facility-administered medications for this visit. (Other)      REVIEW OF SYSTEMS:    ALLERGIES Allergies  Allergen Reactions  . Augmentin [Amoxicillin-Pot Clavulanate]     Causes thrush  . Clindamycin/Lincomycin     PAST MEDICAL HISTORY Past Medical History:  Diagnosis Date  . Dental disease    having implants d/t tooth growing up into sinuses  . Heart valve disorder    "leaking"  . Hypertension    Past Surgical History:  Procedure Laterality Date  . COLONOSCOPY      FAMILY HISTORY Family History  Problem Relation Age of Onset  . Cancer Mother        bladder cancer  . Cancer Father        colon cancer  . Colon cancer Father 23  . Esophageal cancer Neg Hx   . Rectal cancer Neg Hx   . Stomach cancer Neg Hx     SOCIAL HISTORY Social History   Tobacco Use  . Smoking status: Never Smoker  . Smokeless tobacco: Never Used  Vaping Use   . Vaping Use: Never used  Substance Use Topics  . Alcohol use: No    Alcohol/week: 0.0 standard drinks  . Drug use: No         OPHTHALMIC EXAM: Base Eye Exam    Visual Acuity (ETDRS)      Right Left   Dist [place] 20/25 +1 20/100   Dist ph [place]  20/60 +2       Tonometry (Tonopen, 1:06 PM)      Right Left   Pressure 10 09       Pupils      Pupils Dark Light Shape React APD   Right PERRL 6 5 Round Sluggish None   Left PERRL 6 5 Round Sluggish None       Visual Fields (Counting fingers)      Left Right    Full Full       Extraocular Movement      Right Left    Full Full       Neuro/Psych    Oriented x3: Yes   Mood/Affect: Normal       Dilation    Both eyes: 1.0% Mydriacyl, 2.5% Phenylephrine @ 1:11  PM        Slit Lamp and Fundus Exam    External Exam      Right Left   External Normal Normal       Slit Lamp Exam      Right Left   Lids/Lashes Normal Normal   Conjunctiva/Sclera White and quiet White and quiet   Cornea Clear Clear   Anterior Chamber Deep and quiet Deep and quiet   Iris Round and reactive Round and reactive   Lens 3+ Nuclear sclerosis 3+ Nuclear sclerosis   Anterior Vitreous Normal Normal       Fundus Exam      Right Left   Posterior Vitreous Normal Normal   Disc Normal Normal   C/D Ratio 0.45 0.25   Macula Normal Normal   Vessels Normal Normal   Periphery Normal Normal          IMAGING AND PROCEDURES  Imaging and Procedures for 06/02/20  OCT, Retina - OU - Both Eyes       Right Eye Quality was good. Scan locations included subfoveal. Central Foveal Thickness: 312. Progression has been stable. Findings include normal foveal contour.   Left Eye Central Foveal Thickness: 313. Progression has been stable. Findings include normal foveal contour.   Notes No active CME as compared to 2016                ASSESSMENT/PLAN:  Cystoid macular edema of right eye History of CME OS in the past, 2016. Resolved at that time  with systemic p.o. NSAIDs.  no topical or oral medications for treatment, resolved  Nuclear sclerotic cataract of both eyes The nature of cataract was discussed with the patient as well as the elective nature of surgery. The patient was reassured that surgery at a later date does not put the patient at risk for a worse outcome. It was emphasized that the need for surgery is dictated by the patient's quality of life as influenced by the cataract. Patient was instructed to maintain close follow up with their general eye care doctor.  Patient is scheduled to follow-up with Dr. Richardson Chiquito, March 2021.  Each eye has substantial color change to the lens as well as some central nuclear sclerosis cloudiness.      ICD-10-CM   1. Cystoid macular edema of right eye  H35.351 OCT, Retina - OU - Both Eyes  2. Cystoid macular edema of left eye  H35.352 OCT, Retina - OU - Both Eyes  3. Retinal vasculitis of both eyes  H35.063   4. Posterior vitreous detachment of both eyes  H43.813   5. Nuclear sclerotic cataract of both eyes  H25.13     1. History of bilateral CME in the [place] state.   This resolved in years past with systemic p.o. NSAIDs for a brief time. Currently on no therapy.    2. Should cataract surgery be undertaken, oral treatment prior to surgery with Clinoril (sulindac) 200 mg p.o. 1 tablet daily 1 week prior to surgery  3. Dr. Richardson Chiquito may call at any time for questions  4. No active retinal vasculitis in either eye at this time  Ophthalmic Meds Ordered this visit:  No orders of the defined types were placed in this encounter.      Return in about 2 years (around 06/02/2022) for DILATE OU, OCT.  There are no Patient Instructions on file for this visit.   Explained the diagnoses, plan, and follow up with the patient and  they expressed understanding.  Patient expressed understanding of the importance of proper follow up care.   Clent Demark Mariana Goytia M.D. Diseases & Surgery of the  Retina and Vitreous [facility] [place] 06/02/20     Abbreviations: M myopia (nearsighted); A astigmatism; H hyperopia (farsighted); P presbyopia; Mrx spectacle prescription;  CTL contact lenses; OD right eye; OS left eye; OU both eyes  XT exotropia; ET esotropia; PEK punctate epithelial keratitis; PEE punctate epithelial erosions; DES dry eye syndrome; MGD meibomian gland dysfunction; ATs artificial tears; PFAT's preservative free artificial tears; [place] nuclear sclerotic cataract; PSC posterior subcapsular cataract; ERM epi-retinal membrane; PVD posterior vitreous detachment; RD retinal detachment; DM diabetes mellitus; DR diabetic retinopathy; NPDR non-proliferative diabetic retinopathy; PDR proliferative diabetic retinopathy; CSME clinically significant macular edema; DME diabetic macular edema; dbh dot blot hemorrhages; CWS cotton wool spot; POAG primary open angle glaucoma; C/D cup-to-disc ratio; HVF humphrey visual field; GVF goldmann visual field; OCT optical coherence tomography; IOP intraocular pressure; BRVO Branch retinal vein occlusion; CRVO central retinal vein occlusion; CRAO central retinal artery occlusion; BRAO branch retinal artery occlusion; RT retinal tear; SB scleral buckle; PPV pars plana vitrectomy; VH Vitreous hemorrhage; PRP panretinal laser photocoagulation; IVK intravitreal kenalog; VMT vitreomacular traction; MH Macular hole;  NVD neovascularization of the disc; NVE neovascularization elsewhere; AREDS age related eye disease study; ARMD age related macular degeneration; POAG primary open angle glaucoma; EBMD epithelial/anterior basement membrane dystrophy; ACIOL anterior chamber intraocular lens; IOL intraocular lens; PCIOL posterior chamber intraocular lens; Phaco/IOL phacoemulsification with intraocular lens placement; [place] photorefractive keratectomy; LASIK laser assisted in situ keratomileusis; HTN hypertension; DM diabetes mellitus; COPD chronic obstructive pulmonary  disease

## 2020-06-09 NOTE — Progress Notes (Signed)
64 y.o. G0P0000 Married Caucasian female here for annual exam.    Saw PCP 01/2020 to establish care--not Annual Exam per patient.  Denies vaginal bleeding, discharge or pelvic pain.   Not able to do as much dancing as she has been used to doing.   Vaccinated against Covid.   She has a blood pressure machine at home. Range is:  107-125/69-78.  PCP:   Luetta Nutting, DO  Patient's last menstrual period was 08/29/2008.           Sexually active: No.  The current method of family planning is post menopausal status.    Exercising: Yes.    walking--usually dances Smoker:  no  Health Maintenance: Pap: 04-24-19 Neg, 01-27-16 Neg:Neg HR HPV, 07-18-13 Neg:Neg HR HPV History of abnormal Pap:  no MMG: 02-05-20 Neg/density C/BiRads1 Colonoscopy: 07/12/16 Normal. F/u 5 years BMD: n/a  Result  N/a.  Declines for now.  TDaP:  2017 Gardasil:   no HIV: Neg 2001 Hep C: Neg 2001 Screening Labs:  PCP.    reports that she has never smoked. She has never used smokeless tobacco. She reports that she does not drink alcohol and does not use drugs.  Past Medical History:  Diagnosis Date  . Dental disease    having implants d/t tooth growing up into sinuses  . Heart valve disorder    "leaking"  . Hypertension     Past Surgical History:  Procedure Laterality Date  . COLONOSCOPY      Current Outpatient Medications  Medication Sig Dispense Refill  . Ferrous Sulfate (IRON PO) Take 1 tablet by mouth as needed.      No current facility-administered medications for this visit.    Family History  Problem Relation Age of Onset  . Cancer Mother        bladder cancer  . Cancer Father        colon cancer  . Colon cancer Father 67  . Esophageal cancer Neg Hx   . Rectal cancer Neg Hx   . Stomach cancer Neg Hx     Review of Systems  All other systems reviewed and are negative.   Exam:   BP (!) 166/88 Comment: patient states it is always good at home  Pulse (!) 118   Ht 5' 5.5" (1.664 m)    Wt 123 lb (55.8 kg)   LMP 08/29/2008   SpO2 99%   BMI 20.16 kg/m     General appearance: alert, cooperative and appears stated age Head: normocephalic, without obvious abnormality, atraumatic Neck: no adenopathy, supple, symmetrical, trachea midline and thyroid normal to inspection and palpation Lungs: clear to auscultation bilaterally Breasts: normal appearance, no masses or tenderness, No nipple retraction or dimpling, No nipple discharge or bleeding, No axillary adenopathy Heart: regular rate and rhythm Abdomen: soft, non-tender; no masses, no organomegaly Extremities: extremities normal, atraumatic, no cyanosis or edema Skin: skin color, texture, turgor normal. No rashes or lesions Lymph nodes: cervical, supraclavicular, and axillary nodes normal. Neurologic: grossly normal  Pelvic: External genitalia:  no lesions              No abnormal inguinal nodes palpated.              Urethra:  normal appearing urethra with no masses, tenderness or lesions              Bartholins and Skenes: normal                 Vagina: erythema and  orange discharge consistent with atrophy noted.               Cervix: no lesions              Pap taken: No. Bimanual Exam:  Uterus:  normal size, contour, position, consistency, mobility, non-tender              Adnexa: no mass, fullness, tenderness              Rectal exam: Yes.  .  Confirms.              Anus:  normal sphincter tone, no lesions  Chaperone was present for exam.  Assessment:   Well woman visit with normal exam. Atrophy noted. Not SA.  Elevated blood pressure reading.  Normal BP at home.  FH of colon cancer.   Plan: Mammogram screening discussed. Self breast awareness reviewed. Pap and HR HPV as above. Guidelines for Calcium, Vitamin D, regular exercise program including cardiovascular and weight bearing exercise. We discussed vaginal moisturizers and vaginal estrogen if desired to treat atrophy.  Follow up annually and prn.    After visit summary provided.

## 2020-06-10 ENCOUNTER — Other Ambulatory Visit: Payer: Self-pay

## 2020-06-10 ENCOUNTER — Encounter: Payer: Self-pay | Admitting: Obstetrics and Gynecology

## 2020-06-10 ENCOUNTER — Ambulatory Visit (INDEPENDENT_AMBULATORY_CARE_PROVIDER_SITE_OTHER): Payer: Medicare Other | Admitting: Obstetrics and Gynecology

## 2020-06-10 VITALS — BP 166/88 | HR 118 | Ht 65.5 in | Wt 123.0 lb

## 2020-06-10 DIAGNOSIS — Z01419 Encounter for gynecological examination (general) (routine) without abnormal findings: Secondary | ICD-10-CM | POA: Diagnosis not present

## 2020-06-10 NOTE — Patient Instructions (Signed)

## 2020-06-17 ENCOUNTER — Telehealth: Payer: Self-pay

## 2020-06-17 NOTE — Telephone Encounter (Signed)
Patient is calling in regards to having some pain and discomfort after AEX.

## 2020-06-17 NOTE — Telephone Encounter (Signed)
AEX 06/10/20 H/o vaginal atrophy, no HRT  Spoke with pt. Pt states having external vaginal burning, stinging, urgency, frequency with urination, mild abd cramps and some intermittent nausea since leaving AEX x 1 week ago. Pt denies fever, chills, vaginal bleeding. Pt states has not used any OTC medications.  Pt advised to have OV for follow up and further evaluation. Pt agreeable. Pt scheduled with Dr Quincy Simmonds on 10/21 at 430 pm. Pt agreeable to date and time of appt.  Encounter closed  Routing to Dr Quincy Simmonds for review.

## 2020-06-18 ENCOUNTER — Ambulatory Visit (INDEPENDENT_AMBULATORY_CARE_PROVIDER_SITE_OTHER): Payer: Medicare Other | Admitting: Obstetrics and Gynecology

## 2020-06-18 ENCOUNTER — Encounter: Payer: Self-pay | Admitting: Obstetrics and Gynecology

## 2020-06-18 ENCOUNTER — Ambulatory Visit: Payer: Self-pay | Admitting: Obstetrics & Gynecology

## 2020-06-18 ENCOUNTER — Other Ambulatory Visit: Payer: Self-pay

## 2020-06-18 VITALS — BP 170/80 | HR 118 | Ht 65.5 in | Wt 124.0 lb

## 2020-06-18 DIAGNOSIS — R102 Pelvic and perineal pain: Secondary | ICD-10-CM

## 2020-06-18 LAB — POCT URINALYSIS DIPSTICK
Bilirubin, UA: NEGATIVE
Blood, UA: NEGATIVE
Glucose, UA: NEGATIVE
Ketones, UA: NEGATIVE
Leukocytes, UA: NEGATIVE
Nitrite, UA: NEGATIVE
Protein, UA: NEGATIVE
Urobilinogen, UA: 0.2 E.U./dL
pH, UA: 6 (ref 5.0–8.0)

## 2020-06-18 NOTE — Progress Notes (Signed)
GYNECOLOGY  VISIT   HPI: 64 y.o.   Married  Caucasian  female   G0P0000 with Patient's last menstrual period was 08/29/2008.   here for external vaginal burning and low pelvic discomfort. She was having urinary frequency, but that seems to have subsided.  Patient has develop stinging with urination after her routine pelvic examination.  Some groin discomfort after her examination also.  Some bloating.  Seat belt is agrevating the symptoms. BMs are usual.   No vaginal discharge, odor, or itching.   No dietary change.  Urine Dip:Neg   GYNECOLOGIC HISTORY: Patient's last menstrual period was 08/29/2008. Contraception:  PMP Menopausal hormone therapy:  none Last mammogram: 02-05-20 Neg/density C/BiRads1 Last pap smear: 04-24-19 Neg, 01-27-16 Neg:Neg HR HPV, 07-18-13 Neg:Neg HR HPV        OB History    Gravida  0   Para  0   Term  0   Preterm  0   AB  0   Living  0     SAB  0   TAB  0   Ectopic  0   Multiple  0   Live Births                 Patient Active Problem List   Diagnosis Date Noted  . Cystoid macular edema of left eye 06/02/2020  . Retinal vasculitis of both eyes 06/02/2020  . Posterior vitreous detachment of both eyes 06/02/2020  . Cystoid macular edema of right eye 06/02/2020  . Nuclear sclerotic cataract of both eyes 06/02/2020  . History of iron deficiency anemia 02/20/2020  . Impacted cerumen of right ear 02/20/2020  . TACHYCARDIA 06/22/2010  . Elevated blood pressure reading in office with white coat syndrome, without diagnosis of hypertension 12/14/2006  . ECHOCARDIOGRAM, ABNORMAL 12/14/2006    Past Medical History:  Diagnosis Date  . Dental disease    having implants d/t tooth growing up into sinuses  . Heart valve disorder    "leaking"  . Hypertension     Past Surgical History:  Procedure Laterality Date  . COLONOSCOPY      Current Outpatient Medications  Medication Sig Dispense Refill  . Ferrous Sulfate (IRON PO) Take 1  tablet by mouth as needed.      No current facility-administered medications for this visit.     ALLERGIES: Augmentin [amoxicillin-pot clavulanate] and Clindamycin/lincomycin  Family History  Problem Relation Age of Onset  . Cancer Mother        bladder cancer  . Cancer Father        colon cancer  . Colon cancer Father 21  . Esophageal cancer Neg Hx   . Rectal cancer Neg Hx   . Stomach cancer Neg Hx     Social History   Socioeconomic History  . Marital status: Married    Spouse name: Not on file  . Number of children: Not on file  . Years of education: Not on file  . Highest education level: Not on file  Occupational History  . Not on file  Tobacco Use  . Smoking status: Never Smoker  . Smokeless tobacco: Never Used  Vaping Use  . Vaping Use: Never used  Substance and Sexual Activity  . Alcohol use: No    Alcohol/week: 0.0 standard drinks  . Drug use: No  . Sexual activity: Not Currently    Partners: Male    Birth control/protection: Post-menopausal  Other Topics Concern  . Not on file  Social History Narrative  .  Not on file   Social Determinants of Health   Financial Resource Strain:   . Difficulty of Paying Living Expenses: Not on file  Food Insecurity:   . Worried About Charity fundraiser in the Last Year: Not on file  . Ran Out of Food in the Last Year: Not on file  Transportation Needs:   . Lack of Transportation (Medical): Not on file  . Lack of Transportation (Non-Medical): Not on file  Physical Activity:   . Days of Exercise per Week: Not on file  . Minutes of Exercise per Session: Not on file  Stress:   . Feeling of Stress : Not on file  Social Connections:   . Frequency of Communication with Friends and Family: Not on file  . Frequency of Social Gatherings with Friends and Family: Not on file  . Attends Religious Services: Not on file  . Active Member of Clubs or Organizations: Not on file  . Attends Archivist Meetings: Not on  file  . Marital Status: Not on file  Intimate Partner Violence:   . Fear of Current or Ex-Partner: Not on file  . Emotionally Abused: Not on file  . Physically Abused: Not on file  . Sexually Abused: Not on file    Review of Systems  All other systems reviewed and are negative.   PHYSICAL EXAMINATION:    BP (!) 170/80   Pulse (!) 118   Ht 5' 5.5" (1.664 m)   Wt 124 lb (56.2 kg)   LMP 08/29/2008   SpO2 99%   BMI 20.32 kg/m     General appearance: alert, cooperative and appears stated age  Declines pelvic exam but consents to Affirm Q-Tip per vagina.  Pelvic: External genitalia:  no lesions              Urethra:  normal appearing urethra with no masses, tenderness or lesions              Bartholins and Skenes: normal                 Vagina:  Affirm Q-Tip passed without speculum.  Chaperone was present for exam.  ASSESSMENT  Pelvic pain.  I suspect atrophy as the   PLAN  Will send Affirm to rule out vaginitis.  We reviewed water based lubricants, cooking oils, vagina vitamin E suppositories for vaginal hydration.  Patient has recently declined vaginal estrogen and prefers natural approach.

## 2020-06-18 NOTE — Patient Instructions (Signed)
Atrophic Vaginitis  Atrophic vaginitis is a condition in which the tissues that line the vagina become dry and thin. This condition is most common in women who have stopped having regular menstrual periods (are in menopause). This usually starts when a woman is 45-66 years old. That is the time when a woman's estrogen levels begin to drop (decrease). Estrogen is a female hormone. It helps to keep the tissues of the vagina moist. It stimulates the vagina to produce a clear fluid that lubricates the vagina for sexual intercourse. This fluid also protects the vagina from infection. Lack of estrogen can cause the lining of the vagina to get thinner and dryer. The vagina may also shrink in size. It may become less elastic. Atrophic vaginitis tends to get worse over time as a woman's estrogen level drops. What are the causes? This condition is caused by the normal drop in estrogen that happens around the time of menopause. What increases the risk? Certain conditions or situations may lower a woman's estrogen level, leading to a higher risk for atrophic vaginitis. You are more likely to develop this condition if:  You are taking medicines that block estrogen.  You have had your ovaries removed.  You are being treated for cancer with X-ray (radiation) or medicines (chemotherapy).  You have given birth or are breastfeeding.  You are older than age 50.  You smoke. What are the signs or symptoms? Symptoms of this condition include:  Pain, soreness, or bleeding during sexual intercourse (dyspareunia).  Vaginal burning, irritation, or itching.  Pain or bleeding when a speculum is used in a vaginal exam (pelvic exam).  Having burning pain when passing urine.  Vaginal discharge that is brown or yellow. In some cases, there are no symptoms. How is this diagnosed? This condition is diagnosed by taking a medical history and doing a physical exam. This will include a pelvic exam that checks the  vaginal tissues. Though rare, you may also have other tests, including:  A urine test.  A test that checks the acid balance in your vagina (acid balance test). How is this treated? Treatment for this condition depends on how severe your symptoms are. Treatment may include:  Using an over-the-counter vaginal lubricant before sex.  Using a long-acting vaginal moisturizer.  Using low-dose vaginal estrogen for moderate to severe symptoms that do not respond to other treatments. Options include creams, tablets, and inserts (vaginal rings). Before you use a vaginal estrogen, tell your health care provider if you have a history of: ? Breast cancer. ? Endometrial cancer. ? Blood clots. If you are not sexually active and your symptoms are very mild, you may not need treatment. Follow these instructions at home: Medicines  Take over-the-counter and prescription medicines only as told by your health care provider. Do not use herbal or alternative medicines unless your health care provider says that you can.  Use over-the-counter creams, lubricants, or moisturizers for dryness only as directed by your health care provider. General instructions  If your atrophic vaginitis is caused by menopause, discuss all of your menopause symptoms and treatment options with your health care provider.  Do not douche.  Do not use products that can make your vagina dry. These include: ? Scented feminine sprays. ? Scented tampons. ? Scented soaps.  Vaginal intercourse can help to improve blood flow and elasticity of vaginal tissue. If it hurts to have sex, try using a lubricant or moisturizer just before having intercourse. Contact a health care provider if:    Your discharge looks different than normal.  Your vagina has an unusual smell.  You have new symptoms.  Your symptoms do not improve with treatment.  Your symptoms get worse. Summary  Atrophic vaginitis is a condition in which the tissues that  line the vagina become dry and thin. It is most common in women who have stopped having regular menstrual periods (are in menopause).  Treatment options include using vaginal lubricants and low-dose vaginal estrogen.  Contact a health care provider if your vagina has an unusual smell, or if your symptoms get worse or do not improve after treatment. This information is not intended to replace advice given to you by your health care provider. Make sure you discuss any questions you have with your health care provider. Document Revised: 07/28/2017 Document Reviewed: 05/11/2017 Elsevier Patient Education  [place].  Try Astroglide, Replens, or [employer] for water based lubricants.   Consider cooking oils as well, like coconut oil, canola oil, or olive oil.  Check out vaginal vitamin E suppositories on the Internet.  You may find these on [place].

## 2020-06-19 LAB — VAGINITIS/VAGINOSIS, DNA PROBE
Candida Species: NEGATIVE
Gardnerella vaginalis: NEGATIVE
Trichomonas vaginosis: NEGATIVE

## 2020-06-23 ENCOUNTER — Telehealth: Payer: Self-pay

## 2020-06-23 NOTE — Telephone Encounter (Signed)
-----   Message from Nunzio Cobbs, MD sent at 06/22/2020 10:44 AM EDT ----- Results to patient through My Chart.  Hello Melissa Russell,  Your vaginitis testing is negative for infection.   Please contact the office for any questions.   Have a good day!  Josefa Half, MD

## 2020-06-23 NOTE — Telephone Encounter (Signed)
Left message to call Rufus Cypert, CMA. °

## 2020-06-24 NOTE — Telephone Encounter (Signed)
Spoke with patient and notified her of negative vaginitis testing.

## 2020-06-24 NOTE — Telephone Encounter (Signed)
Patient is returning call.  °

## 2020-12-02 ENCOUNTER — Telehealth: Payer: Self-pay

## 2020-12-02 NOTE — Telephone Encounter (Signed)
Please contact the patient to schedule an appointment. Received documentation to be completed by Dr. West Pugh for Melissa Russell' entrance into [facility]. Unable to complete the documentation without an appt and labs. Patient not seen since last August.

## 2020-12-04 NOTE — Telephone Encounter (Signed)
Appointment has been made

## 2020-12-07 ENCOUNTER — Other Ambulatory Visit: Payer: Self-pay

## 2020-12-07 ENCOUNTER — Ambulatory Visit (INDEPENDENT_AMBULATORY_CARE_PROVIDER_SITE_OTHER): Payer: Medicare Other | Admitting: Family Medicine

## 2020-12-07 ENCOUNTER — Encounter: Payer: Self-pay | Admitting: Family Medicine

## 2020-12-07 ENCOUNTER — Other Ambulatory Visit: Payer: Self-pay | Admitting: Family Medicine

## 2020-12-07 VITALS — BP 182/83 | HR 132 | Temp 97.6°F | Ht 65.55 in | Wt 121.1 lb

## 2020-12-07 DIAGNOSIS — R030 Elevated blood-pressure reading, without diagnosis of hypertension: Secondary | ICD-10-CM

## 2020-12-07 DIAGNOSIS — Z111 Encounter for screening for respiratory tuberculosis: Secondary | ICD-10-CM | POA: Diagnosis not present

## 2020-12-07 NOTE — Patient Instructions (Signed)
Preventive Care 67 Years and Older, Female Preventive care refers to lifestyle choices and visits with your health care provider that can promote health and wellness. This includes:  A yearly physical exam. This is also called an annual wellness visit.  Regular dental and eye exams.  Immunizations.  Screening for certain conditions.  Healthy lifestyle choices, such as: ? Eating a healthy diet. ? Getting regular exercise. ? Not using drugs or products that contain nicotine and tobacco. ? Limiting alcohol use. What can I expect for my preventive care visit? Physical exam Your health care provider will check your:  Height and weight. These may be used to calculate your BMI (body mass index). BMI is a measurement that tells if you are at a healthy weight.  Heart rate and blood pressure.  Body temperature.  Skin for abnormal spots. Counseling Your health care provider may ask you questions about your:  Past medical problems.  Family's medical history.  Alcohol, tobacco, and drug use.  Emotional well-being.  Home life and relationship well-being.  Sexual activity.  Diet, exercise, and sleep habits.  History of falls.  Memory and ability to understand (cognition).  Work and work Statistician.  Pregnancy and menstrual history.  Access to firearms. What immunizations do I need? Vaccines are usually given at various ages, according to a schedule. Your health care provider will recommend vaccines for you based on your age, medical history, and lifestyle or other factors, such as travel or where you work.   What tests do I need? Blood tests  Lipid and cholesterol levels. These may be checked every 5 years, or more often depending on your overall health.  Hepatitis C test.  Hepatitis B test. Screening  Lung cancer screening. You may have this screening every year starting at age 67 if you have a 30-pack-year history of smoking and currently smoke or have quit within  the past 15 years.  Colorectal cancer screening. ? All adults should have this screening starting at age 67 and continuing until age 77. ? Your health care provider may recommend screening at age 67 if you are at increased risk. ? You will have tests every 1-10 years, depending on your results and the type of screening test.  Diabetes screening. ? This is done by checking your blood sugar (glucose) after you have not eaten for a while (fasting). ? You may have this done every 1-3 years.  Mammogram. ? This may be done every 1-2 years. ? Talk with your health care provider about how often you should have regular mammograms.  Abdominal aortic aneurysm (AAA) screening. You may need this if you are a current or former smoker.  BRCA-related cancer screening. This may be done if you have a family history of breast, ovarian, tubal, or peritoneal cancers. Other tests  STD (sexually transmitted disease) testing, if you are at risk.  Bone density scan. This is done to screen for osteoporosis. You may have this done starting at age 67. Talk with your health care provider about your test results, treatment options, and if necessary, the need for more tests. Follow these instructions at home: Eating and drinking  Eat a diet that includes fresh fruits and vegetables, whole grains, lean protein, and low-fat dairy products. Limit your intake of foods with high amounts of sugar, saturated fats, and salt.  Take vitamin and mineral supplements as recommended by your health care provider.  Do not drink alcohol if your health care provider tells you not to drink.  If you drink alcohol: ? Limit how much you have to 0-1 drink a day. ? Be aware of how much alcohol is in your drink. In the [place] one drink equals one 12 oz bottle of beer (355 mL), one 5 oz glass of wine (148 mL), or one 1 oz glass of hard liquor (44 mL).   Lifestyle  Take daily care of your teeth and gums. Brush your teeth every morning  and night with fluoride toothpaste. Floss one time each day.  Stay active. Exercise for at least 30 minutes 5 or more days each week.  Do not use any products that contain nicotine or tobacco, such as cigarettes, e-cigarettes, and chewing tobacco. If you need help quitting, ask your health care provider.  Do not use drugs.  If you are sexually active, practice safe sex. Use a condom or other form of protection in order to prevent STIs (sexually transmitted infections).  Talk with your health care provider about taking a low-dose aspirin or statin.  Find healthy ways to cope with stress, such as: ? Meditation, yoga, or listening to music. ? Journaling. ? Talking to a trusted person. ? Spending time with friends and family. Safety  Always wear your seat belt while driving or riding in a vehicle.  Do not drive: ? If you have been drinking alcohol. Do not ride with someone who has been drinking. ? When you are tired or distracted. ? While texting.  Wear a helmet and other protective equipment during sports activities.  If you have firearms in your house, make sure you follow all gun safety procedures. What's next?  Visit your health care provider once a year for an annual wellness visit.  Ask your health care provider how often you should have your eyes and teeth checked.  Stay up to date on all vaccines. This information is not intended to replace advice given to you by your health care provider. Make sure you discuss any questions you have with your health care provider. Document Revised: 08/05/2020 Document Reviewed: 08/09/2018 Elsevier Patient Education  2021 [employer]  

## 2020-12-09 DIAGNOSIS — Z111 Encounter for screening for respiratory tuberculosis: Secondary | ICD-10-CM | POA: Insufficient documentation

## 2020-12-09 LAB — QUANTIFERON-TB GOLD PLUS
Mitogen-NIL: >10 IU/mL
NIL: 0.02 IU/mL
QuantiFERON-TB Gold Plus: NEGATIVE
TB1-NIL: 0 IU/mL
TB2-NIL: 0.01 IU/mL

## 2020-12-09 NOTE — Progress Notes (Signed)
Melissa Russell - 65 y.o. female MRN [id number]  Date of birth: 12/03/1955  Subjective No chief complaint on file.   HPI Melissa Russell is a 65 y.o. female here today for evaluation for retirement community.  She is planning on moving into a cottage at [place] with her husband.   She has been in fairly good health.  Her blood pressure is often elevated in clinic but numbers that she brought for review today are all within a normal range.  She remains very active.  She and her husband frequently square dance.  She has not symptoms related to HTN including chest pain, shortness of breath,palpitations, headache or vision changes.  She can completed all ADL's.   She needs MOCA and TB testing for admission.  She has no additional concerns today.   ROS:  A comprehensive ROS was completed and negative except as noted per HPI Allergies  Allergen Reactions  . Augmentin [Amoxicillin-Pot Clavulanate]     Causes thrush  . Clindamycin/Lincomycin     Past Medical History:  Diagnosis Date  . Dental disease    having implants d/t tooth growing up into sinuses  . Heart valve disorder    "leaking"  . Hypertension     Past Surgical History:  Procedure Laterality Date  . COLONOSCOPY      Social History   Socioeconomic History  . Marital status: Married    Spouse name: Not on file  . Number of children: Not on file  . Years of education: Not on file  . Highest education level: Not on file  Occupational History  . Not on file  Tobacco Use  . Smoking status: Never Smoker  . Smokeless tobacco: Never Used  Vaping Use  . Vaping Use: Never used  Substance and Sexual Activity  . Alcohol use: No    Alcohol/week: 0.0 standard drinks  . Drug use: No  . Sexual activity: Not Currently    Partners: Male    Birth control/protection: Post-menopausal  Other Topics Concern  . Not on file  Social History Narrative  . Not on file   Social Determinants of Health   Financial Resource Strain: Not on  file  Food Insecurity: Not on file  Transportation Needs: Not on file  Physical Activity: Not on file  Stress: Not on file  Social Connections: Not on file    Family History  Problem Relation Age of Onset  . Cancer Mother        bladder cancer  . Cancer Father        colon cancer  . Colon cancer Father 59  . Esophageal cancer Neg Hx   . Rectal cancer Neg Hx   . Stomach cancer Neg Hx     Health Maintenance  Topic Date Due  . Hepatitis C Screening  Never done  . DEXA SCAN  Never done  . PNA vac Low Risk Adult (1 of 2 - PCV13) Never done  . COVID-19 Vaccine (3 - Booster for Pfizer series) 03/27/2020  . INFLUENZA VACCINE  03/29/2021  . COLONOSCOPY (Pts 45-33yrs Insurance coverage will need to be confirmed)  07/12/2021  . MAMMOGRAM  02/04/2022  . TETANUS/TDAP  04/18/2026  . HPV VACCINES  Aged Out     ----------------------------------------------------------------------------------------------------------------------------------------------------------------------------------------------------------------- Physical Exam BP (!) 182/83 (BP Location: Left Arm, Patient Position: Sitting, Cuff Size: Small)   Pulse (!) 132   Temp 97.6 F (36.4 C)   Ht 5' 5.55" (1.665 m)   Wt 121 lb 1.6 oz (54.9  kg)   LMP 08/29/2008   SpO2 99%   BMI 19.81 kg/m   Physical Exam Constitutional:      Appearance: Normal appearance.  Eyes:     General: No scleral icterus. Cardiovascular:     Rate and Rhythm: Normal rate and regular rhythm.  Pulmonary:     Effort: Pulmonary effort is normal.     Breath sounds: Normal breath sounds.  Musculoskeletal:     Cervical back: Neck supple.  Skin:    General: Skin is warm and dry.  Neurological:     General: No focal deficit present.     Mental Status: She is alert.  Psychiatric:        Mood and Affect: Mood normal.        Behavior: Behavior normal.    MOCA:  29/30 ------------------------------------------------------------------------------------------------------------------------------------------------------------------------------------------------------------------- Assessment and Plan  Elevated blood pressure reading in office with white coat syndrome, without diagnosis of hypertension BP remains elevated in clinic however readings at home are very good.  Continue home monitoring and let me know if >140/90 at home.    Screening examination for pulmonary tuberculosis Quantiferon gold ordered.   Form completed for admission to [facility]. Quantiferon ordered.  No orders of the defined types were placed in this encounter.   No follow-ups on file.    This visit occurred during the SARS-CoV-2 public health emergency.  Safety protocols were in place, including screening questions prior to the visit, additional usage of staff PPE, and extensive cleaning of exam room while observing appropriate contact time as indicated for disinfecting solutions.

## 2020-12-09 NOTE — Assessment & Plan Note (Signed)
BP remains elevated in clinic however readings at home are very good.  Continue home monitoring and let me know if >140/90 at home.

## 2020-12-09 NOTE — Assessment & Plan Note (Signed)
Quantiferon gold ordered

## 2020-12-30 ENCOUNTER — Other Ambulatory Visit: Payer: Self-pay | Admitting: Obstetrics and Gynecology

## 2020-12-30 DIAGNOSIS — Z1231 Encounter for screening mammogram for malignant neoplasm of breast: Secondary | ICD-10-CM

## 2021-01-18 ENCOUNTER — Ambulatory Visit (INDEPENDENT_AMBULATORY_CARE_PROVIDER_SITE_OTHER): Payer: Medicare Other | Admitting: Family Medicine

## 2021-01-18 ENCOUNTER — Other Ambulatory Visit: Payer: Self-pay

## 2021-01-18 ENCOUNTER — Encounter: Payer: Self-pay | Admitting: Family Medicine

## 2021-01-18 VITALS — BP 152/87 | HR 98 | Resp 16

## 2021-01-18 DIAGNOSIS — R21 Rash and other nonspecific skin eruption: Secondary | ICD-10-CM | POA: Diagnosis not present

## 2021-01-18 MED ORDER — TRIAMCINOLONE ACETONIDE 0.1 % EX OINT: 1.0000 "application " | TOPICAL_OINTMENT | Freq: Two times a day (BID) | CUTANEOUS | 6 refills | Status: DC

## 2021-01-18 NOTE — Progress Notes (Signed)
Acute Office Visit  Subjective:    Patient ID: Melissa Russell, female    DOB: Jun 25, 1956, 65 y.o.   MRN: [id number]  Chief Complaint  Patient presents with  . Rash    HPI Patient is in today for rash/bite.   Patient states that when she woke up Friday morning she noticed a bite on her right shoulder/neck area.  She reports it was a little bit itchy at first but never seem to be bad.  She now does not have any symptoms and would not notice it was there if she did not look for it.  Area still seems a little red and since it has been several days she wanted to get it checked.  States she was not outside much last week apart from getting the mail.  However she had been cleaning her house and dusting cobwebs, but she never felt a bite of any sort.  She denies any fevers, body aches, malaise, fatigue, other rashes, pain, neck stiffness, drainage, burning, tingling, itching at present. She tried a topical antihistamine which relieved the initial itching, but did not affect the redness. She never visualized any insect on her skin (spider, tick, etc) and reports she never had a "bullseye rash."     Past Medical History:  Diagnosis Date  . Dental disease    having implants d/t tooth growing up into sinuses  . Heart valve disorder    "leaking"  . Hypertension     Past Surgical History:  Procedure Laterality Date  . COLONOSCOPY      Family History  Problem Relation Age of Onset  . Cancer Mother        bladder cancer  . Cancer Father        colon cancer  . Colon cancer Father 53  . Esophageal cancer Neg Hx   . Rectal cancer Neg Hx   . Stomach cancer Neg Hx     Social History   Socioeconomic History  . Marital status: Married    Spouse name: Not on file  . Number of children: Not on file  . Years of education: Not on file  . Highest education level: Not on file  Occupational History  . Not on file  Tobacco Use  . Smoking status: Never Smoker  . Smokeless tobacco: Never Used   Vaping Use  . Vaping Use: Never used  Substance and Sexual Activity  . Alcohol use: No    Alcohol/week: 0.0 standard drinks  . Drug use: No  . Sexual activity: Not Currently    Partners: Male    Birth control/protection: Post-menopausal  Other Topics Concern  . Not on file  Social History Narrative  . Not on file   Social Determinants of Health   Financial Resource Strain: Not on file  Food Insecurity: Not on file  Transportation Needs: Not on file  Physical Activity: Not on file  Stress: Not on file  Social Connections: Not on file  Intimate Partner Violence: Not on file    Outpatient Medications Prior to Visit  Medication Sig Dispense Refill  . Ferrous Sulfate (IRON PO) Take 1 tablet by mouth as needed.      No facility-administered medications prior to visit.    Allergies  Allergen Reactions  . Augmentin [Amoxicillin-Pot Clavulanate]     Causes thrush  . Clindamycin/Lincomycin     Review of Systems All review of systems negative except what is listed in the HPI     Objective:  Physical Exam Constitutional:      Appearance: Normal appearance. She is normal weight.  Musculoskeletal:     Cervical back: Normal range of motion and neck supple. No rigidity or tenderness.  Lymphadenopathy:     Cervical: No cervical adenopathy.  Skin:    Findings: Rash present.     Comments: Bite/rash as pictured below, mild erythema, no edema, induration, tenderness, open lesions, drainage or weeping.  Neurological:     General: No focal deficit present.     Mental Status: She is alert and oriented to person, place, and time. Mental status is at baseline.  Psychiatric:        Mood and Affect: Mood normal.        Behavior: Behavior normal.        Thought Content: Thought content normal.        Judgment: Judgment normal.          BP (!) 152/87   Pulse 98   Resp 16   LMP 08/29/2008   SpO2 100%  Wt Readings from Last 3 Encounters:  12/07/20 121 lb 1.6 oz (54.9  kg)  06/18/20 124 lb (56.2 kg)  06/10/20 123 lb (55.8 kg)    Health Maintenance Due  Topic Date Due  . Hepatitis C Screening  Never done  . DEXA SCAN  Never done  . PNA vac Low Risk Adult (1 of 2 - PCV13) Never done  . COVID-19 Vaccine (3 - Booster for Pfizer series) 02/26/2020    There are no preventive care reminders to display for this patient.   Lab Results  Component Value Date   TSH 2.96 02/20/2020   Lab Results  Component Value Date   WBC 5.2 02/20/2020   HGB 14.6 02/20/2020   HCT 43.0 02/20/2020   MCV 85.3 02/20/2020   PLT 229 02/20/2020   Lab Results  Component Value Date   NA 139 02/20/2020   K 4.5 02/20/2020   CO2 26 02/20/2020   GLUCOSE 112 (H) 02/20/2020   BUN 9 02/20/2020   CREATININE 0.69 02/20/2020   BILITOT 0.7 02/20/2020   ALKPHOS 71 04/18/2012   AST 16 02/20/2020   ALT 11 02/20/2020   PROT 7.0 02/20/2020   ALBUMIN 4.2 04/18/2012   CALCIUM 10.0 02/20/2020   Lab Results  Component Value Date   CHOL 196 02/20/2020   Lab Results  Component Value Date   HDL 77 02/20/2020   Lab Results  Component Value Date   LDLCALC 100 (H) 02/20/2020   Lab Results  Component Value Date   TRIG 92 02/20/2020   Lab Results  Component Value Date   CHOLHDL 2.5 02/20/2020   No results found for: HGBA1C     Assessment & Plan:   1. Rash  Area looks relatively mild today and is not causing any symptoms. Patient well aware of antibiotic stewardship and would prefer to avoid antibiotics if possible. Will start with steroid ointment and have her monitor the area closely. If area does not start improving, worsens, or new symptoms develop then she will send a MyChart picture and we can start an antibiotic. Patient aware of signs and symptoms requiring urgent evaluation.  - triamcinolone ointment (KENALOG) 0.1 %; Apply 1 application topically 2 (two) times daily. To affected areas  Dispense: 60 g; Refill: 6   -BP elevated today, but patient reports white  coat syndrome. Last reading at home a few days ago was 112/70s. Continue to monitor at home.  Follow-up if symptoms worsen or fail to improve.   Terrilyn Saver, NP

## 2021-01-18 NOTE — Patient Instructions (Signed)
Steroid ointment for the next several day, morning and night. If not starting to improve or worsens, send me a picture and we can start an antibiotic.

## 2021-02-09 ENCOUNTER — Encounter (INDEPENDENT_AMBULATORY_CARE_PROVIDER_SITE_OTHER): Payer: Self-pay

## 2021-02-10 ENCOUNTER — Encounter (INDEPENDENT_AMBULATORY_CARE_PROVIDER_SITE_OTHER): Payer: Medicare Other | Admitting: Ophthalmology

## 2021-02-11 ENCOUNTER — Other Ambulatory Visit: Payer: Self-pay

## 2021-02-11 ENCOUNTER — Encounter (INDEPENDENT_AMBULATORY_CARE_PROVIDER_SITE_OTHER): Payer: Medicare Other | Admitting: Ophthalmology

## 2021-02-11 ENCOUNTER — Ambulatory Visit (INDEPENDENT_AMBULATORY_CARE_PROVIDER_SITE_OTHER): Payer: Medicare Other | Admitting: Ophthalmology

## 2021-02-11 ENCOUNTER — Encounter (INDEPENDENT_AMBULATORY_CARE_PROVIDER_SITE_OTHER): Payer: Self-pay | Admitting: Ophthalmology

## 2021-02-11 DIAGNOSIS — H43823 Vitreomacular adhesion, bilateral: Secondary | ICD-10-CM | POA: Insufficient documentation

## 2021-02-11 DIAGNOSIS — H35351 Cystoid macular degeneration, right eye: Secondary | ICD-10-CM

## 2021-02-11 DIAGNOSIS — H35352 Cystoid macular degeneration, left eye: Secondary | ICD-10-CM

## 2021-02-11 DIAGNOSIS — H2511 Age-related nuclear cataract, right eye: Secondary | ICD-10-CM | POA: Diagnosis not present

## 2021-02-11 DIAGNOSIS — Z961 Presence of intraocular lens: Secondary | ICD-10-CM | POA: Diagnosis not present

## 2021-02-11 HISTORY — DX: Vitreomacular adhesion, bilateral: H43.823

## 2021-02-11 MED ORDER — SULINDAC 150 MG PO TABS: 150.0000 mg | ORAL_TABLET | Freq: Two times a day (BID) | ORAL | Status: AC

## 2021-02-11 MED ORDER — BROMFENAC SODIUM 0.07 % OP SOLN: 1.0000 [drp] | Freq: Two times a day (BID) | OPHTHALMIC | 3 refills | Status: DC

## 2021-02-11 NOTE — Assessment & Plan Note (Signed)
No active CME OD at this time

## 2021-02-11 NOTE — Assessment & Plan Note (Signed)
The nature of cystoid macular edema including causes, exacerbating factors, and treatments including steroid and nonsteroidal anti-inflammatory drops, periocular injections of steroids, and intravitreal injections of steroids were reviewed. An informational brochure was offered.  The patient's questions were answered. The potential side effects of the various treatments were reviewed including the potential for intraocular pressure rise with steroid treatments.  I often use topical NSAIDS alone or in conjunction with periocular steroids to resolve this condition.   Patient instructed not to compress or "rub" on the eye.  More rarely, surgery may be considered if the condition is not improving.  Currently OS on no topical therapy.  We will asked patient to commence with topical Prolensa 1 drop left eye twice daily.  3 small sample bottles provided to the patient

## 2021-02-11 NOTE — Progress Notes (Addendum)
02/11/2021     CHIEF COMPLAINT Patient presents for Retina Follow Up (WIP- CME OS- referred by Dr. Loletha Grayer. Groat/Pt states, "I had cataract surgery in my left eye in April and it seemed to go ok. About a week ago I noticed I was not seeing well in my left eye. I went to Dr. Katy Fitch and he told me that I have the same swelling in the back of my eye that I had before." )   HISTORY OF PRESENT ILLNESS: Melissa Russell is a 65 y.o. female who presents to the clinic today for:   HPI     Retina Follow Up           Diagnosis: Other   Laterality: left eye   Onset: 1 week ago   Severity: mild   Duration: 1 week   Course: gradually worsening   Comments: WIP- CME OS- referred by Dr. Shirleen Schirmer Pt states, "I had cataract surgery in my left eye in April and it seemed to go ok. About a week ago I noticed I was not seeing well in my left eye. I went to Dr. Katy Fitch and he told me that I have the same swelling in the back of my eye that I had before."        Last edited by Kendra Opitz, COA on 02/11/2021  9:55 AM.      Referring physician: Luetta Nutting, DO [address]  [address] [place],  [place] [address]  HISTORICAL INFORMATION:   Selected notes from the MEDICAL RECORD NUMBER       CURRENT MEDICATIONS: Current Outpatient Medications (Ophthalmic Drugs)  Medication Sig   Bromfenac Sodium 0.07 % SOLN Place 1 drop into the left eye 2 (two) times daily.   No current facility-administered medications for this visit. (Ophthalmic Drugs)   Current Outpatient Medications (Other)  Medication Sig   Ferrous Sulfate (IRON PO) Take 1 tablet by mouth as needed.    triamcinolone ointment (KENALOG) 0.1 % Apply 1 application topically 2 (two) times daily. To affected areas   Current Facility-Administered Medications (Other)  Medication Route   sulindac (CLINORIL) tablet 150 mg Oral      REVIEW OF SYSTEMS:    ALLERGIES Allergies  Allergen Reactions   Augmentin [Amoxicillin-Pot  Clavulanate]     Causes thrush   Clindamycin/Lincomycin     PAST MEDICAL HISTORY Past Medical History:  Diagnosis Date   Dental disease    having implants d/t tooth growing up into sinuses   Heart valve disorder    "leaking"   Hypertension    Past Surgical History:  Procedure Laterality Date   COLONOSCOPY      FAMILY HISTORY Family History  Problem Relation Age of Onset   Cancer Mother        bladder cancer   Cancer Father        colon cancer   Colon cancer Father 79   Esophageal cancer Neg Hx    Rectal cancer Neg Hx    Stomach cancer Neg Hx     SOCIAL HISTORY Social History   Tobacco Use   Smoking status: Never   Smokeless tobacco: Never  Vaping Use   Vaping Use: Never used  Substance Use Topics   Alcohol use: No    Alcohol/week: 0.0 standard drinks   Drug use: No         OPHTHALMIC EXAM:  Base Eye Exam     Visual Acuity (ETDRS)  Right Left   Dist [place] 20/25 -1 20/200   Dist ph [place]  NI         Tonometry (Tonopen, 9:58 AM)       Right Left   Pressure 14 14         Pupils       Pupils Dark Light Shape React APD   Right PERRL 6 6 Round Minimal None   Left PERRL 6 6 Round Minimal None         Visual Fields (Counting fingers)       Left Right    Full Full         Extraocular Movement       Right Left    Full Full         Neuro/Psych     Oriented x3: Yes   Mood/Affect: Normal         Dilation     Both eyes: 1.0% Mydriacyl, 2.5% Phenylephrine @ 9:58 AM           Slit Lamp and Fundus Exam     External Exam       Right Left   External Normal Normal         Slit Lamp Exam       Right Left   Lids/Lashes Normal Normal   Conjunctiva/Sclera White and quiet White and quiet   Cornea Clear Clear   Anterior Chamber Deep and quiet Deep and quiet   Iris Round and reactive, brown, diffuse  transillumination defects 360 Round and reactive, brown, diffuse  transillumination defects 360   Lens 3+ Nuclear  sclerosis Posterior chamber intraocular lens well centered   Anterior Vitreous Normal Normal         Fundus Exam       Right Left   Posterior Vitreous Normal Normal   Disc Normal Normal   C/D Ratio 0.45 0.25   Macula Normal Cystoid macular edema   Vessels Normal Normal   Periphery Normal Normal            IMAGING AND PROCEDURES  Imaging and Procedures for 02/11/21  OCT, Retina - OU - Both Eyes       Right Eye Quality was good. Scan locations included subfoveal. Central Foveal Thickness: 323. Progression has been stable. Findings include normal foveal contour.   Left Eye Quality was good. Scan locations included subfoveal. Central Foveal Thickness: 667. Progression has been stable. Findings include normal foveal contour, cystoid macular edema.   Notes New onset OS, con current and concomitant with recent cataract extraction with intraocular lens placement in uncomplicated fashion, less now pseudophakic CME but CME has been present in this eye without prior cataract surgery as well.               ASSESSMENT/PLAN:  Cystoid macular edema of left eye The nature of cystoid macular edema including causes, exacerbating factors, and treatments including steroid and nonsteroidal anti-inflammatory drops, periocular injections of steroids, and intravitreal injections of steroids were reviewed. An informational brochure was offered.  The patient's questions were answered. The potential side effects of the various treatments were reviewed including the potential for intraocular pressure rise with steroid treatments.  I often use topical NSAIDS alone or in conjunction with periocular steroids to resolve this condition.   Patient instructed not to compress or "rub" on the eye.  More rarely, surgery may be considered if the condition is not improving.  Currently OS on no topical therapy.  We will  asked patient to commence with topical Prolensa 1 drop left eye twice daily.  3 small  sample bottles provided to the patient  Nuclear sclerotic cataract of right eye Visually significant though patient is obviously reluctant to proceed with current CME in the left eye  Cystoid macular edema of right eye No active CME OD at this time  Vitreomacular adhesion of both eyes No specific causative factor in CME at this time     ICD-10-CM   1. Cystoid macular edema of left eye  H35.352 OCT, Retina - OU - Both Eyes    2. Pseudophakia of left eye  Z96.1     3. Nuclear sclerotic cataract of right eye  H25.11     4. Cystoid macular edema of right eye  H35.351     5. Vitreomacular adhesion of both eyes  H43.823       1.  OS and OD in the past with CME unrelated to prior cataract surgery.  Did resolve however with use of oral NSAIDs, specifically those with with renal excretion, sulindac 200 mg 1 tablet p.o. twice daily years prior to this visit.  She no longer is on this medication.  2.  OS with pseudophakic CME likely as a consequence of having a propensity for this in the past in the unoperated state  Thus we will resume topical therapy with topical Prolensa OS 1 drop left eye twice daily and resume Clinoril with a smaller dose of 150 mg p.o. twice daily  3.  Ophthalmic Meds Ordered this visit:  Meds ordered this encounter  Medications   sulindac (CLINORIL) tablet 150 mg   Bromfenac Sodium 0.07 % SOLN    Sig: Place 1 drop into the left eye 2 (two) times daily.    Dispense:  5 mL    Refill:  3       Return in about 5 weeks (around 03/18/2021) for DILATE OU, OCT, OS.  There are no Patient Instructions on file for this visit.  Patient called letter today saying that the scleral sulindac was not sent into the pharmacy, and by epic standards and review the apparently this did get sent into [facility].  Dr. Zadie Rhine called the pharmacy at 1:30 PM and the pharmacy is "closed for the day"  Patient also called saying that the topical Prolensa/bromfenac was out of  date by 2 months.  The samples given to her.  I will send in a prescription for Prolensa bromfenac 1 drop twice daily left eye   Explained the diagnoses, plan, and follow up with the patient and they expressed understanding.  Patient expressed understanding of the importance of proper follow up care.   Clent Demark Para Cossey M.D. Diseases & Surgery of the Retina and Vitreous [facility] [place] 02/11/21     Abbreviations: M myopia (nearsighted); A astigmatism; H hyperopia (farsighted); P presbyopia; Mrx spectacle prescription;  CTL contact lenses; OD right eye; OS left eye; OU both eyes  XT exotropia; ET esotropia; PEK punctate epithelial keratitis; PEE punctate epithelial erosions; DES dry eye syndrome; MGD meibomian gland dysfunction; ATs artificial tears; PFAT's preservative free artificial tears; [place] nuclear sclerotic cataract; PSC posterior subcapsular cataract; ERM epi-retinal membrane; PVD posterior vitreous detachment; RD retinal detachment; DM diabetes mellitus; DR diabetic retinopathy; NPDR non-proliferative diabetic retinopathy; PDR proliferative diabetic retinopathy; CSME clinically significant macular edema; DME diabetic macular edema; dbh dot blot hemorrhages; CWS cotton wool spot; POAG primary open angle glaucoma; C/D cup-to-disc ratio; HVF humphrey visual field; GVF  goldmann visual field; OCT optical coherence tomography; IOP intraocular pressure; BRVO Branch retinal vein occlusion; CRVO central retinal vein occlusion; CRAO central retinal artery occlusion; BRAO branch retinal artery occlusion; RT retinal tear; SB scleral buckle; PPV pars plana vitrectomy; VH Vitreous hemorrhage; PRP panretinal laser photocoagulation; IVK intravitreal kenalog; VMT vitreomacular traction; MH Macular hole;  NVD neovascularization of the disc; NVE neovascularization elsewhere; AREDS age related eye disease study; ARMD age related macular degeneration; POAG primary open angle glaucoma; EBMD  epithelial/anterior basement membrane dystrophy; ACIOL anterior chamber intraocular lens; IOL intraocular lens; PCIOL posterior chamber intraocular lens; Phaco/IOL phacoemulsification with intraocular lens placement; [place] photorefractive keratectomy; LASIK laser assisted in situ keratomileusis; HTN hypertension; DM diabetes mellitus; COPD chronic obstructive pulmonary disease

## 2021-02-11 NOTE — Addendum Note (Signed)
Addended by: Deloria Lair A on: 02/11/2021 01:34 PM   Modules accepted: Orders

## 2021-02-11 NOTE — Assessment & Plan Note (Signed)
Visually significant though patient is obviously reluctant to proceed with current CME in the left eye

## 2021-02-11 NOTE — Assessment & Plan Note (Signed)
No specific causative factor in CME at this time

## 2021-02-26 ENCOUNTER — Other Ambulatory Visit: Payer: Self-pay

## 2021-02-26 ENCOUNTER — Ambulatory Visit
Admission: RE | Admit: 2021-02-26 | Discharge: 2021-02-26 | Disposition: A | Discharge status: Home / Self Care | Payer: Medicare Other | Source: Ambulatory Visit | Attending: Obstetrics and Gynecology | Admitting: Obstetrics and Gynecology

## 2021-02-26 DIAGNOSIS — Z1231 Encounter for screening mammogram for malignant neoplasm of breast: Secondary | ICD-10-CM

## 2021-03-04 ENCOUNTER — Encounter: Payer: Self-pay | Admitting: Obstetrics and Gynecology

## 2021-03-04 ENCOUNTER — Other Ambulatory Visit: Payer: Self-pay | Admitting: Obstetrics and Gynecology

## 2021-03-04 DIAGNOSIS — R928 Other abnormal and inconclusive findings on diagnostic imaging of breast: Secondary | ICD-10-CM

## 2021-03-11 ENCOUNTER — Other Ambulatory Visit: Payer: Self-pay

## 2021-03-11 ENCOUNTER — Ambulatory Visit
Admission: RE | Admit: 2021-03-11 | Discharge: 2021-03-11 | Disposition: A | Discharge status: Home / Self Care | Payer: Medicare Other | Source: Ambulatory Visit | Attending: Obstetrics and Gynecology | Admitting: Obstetrics and Gynecology

## 2021-03-11 ENCOUNTER — Ambulatory Visit: Payer: Medicare Other

## 2021-03-11 DIAGNOSIS — R928 Other abnormal and inconclusive findings on diagnostic imaging of breast: Secondary | ICD-10-CM

## 2021-03-18 ENCOUNTER — Ambulatory Visit (INDEPENDENT_AMBULATORY_CARE_PROVIDER_SITE_OTHER): Payer: Medicare Other | Admitting: Ophthalmology

## 2021-03-18 ENCOUNTER — Other Ambulatory Visit: Payer: Self-pay

## 2021-03-18 DIAGNOSIS — H2511 Age-related nuclear cataract, right eye: Secondary | ICD-10-CM | POA: Diagnosis not present

## 2021-03-18 DIAGNOSIS — H35351 Cystoid macular degeneration, right eye: Secondary | ICD-10-CM

## 2021-03-18 DIAGNOSIS — H35352 Cystoid macular degeneration, left eye: Secondary | ICD-10-CM

## 2021-03-18 MED ORDER — KETOROLAC TROMETHAMINE 0.5 % OP SOLN: 1.0000 [drp] | Freq: Four times a day (QID) | OPHTHALMIC | 3 refills | Status: DC

## 2021-03-18 NOTE — Assessment & Plan Note (Signed)
Follow-up with Dr. Duanne Guess as scheduled

## 2021-03-18 NOTE — Progress Notes (Signed)
03/18/2021     CHIEF COMPLAINT Patient presents for Retina Evaluation   HISTORY OF PRESENT ILLNESS: Melissa Russell is a 65 y.o. female who presents to the clinic today for:   HPI     Retina Evaluation           Laterality: left eye       Last edited by Hurman Horn, MD on 03/18/2021  9:42 AM.      Referring physician: Luetta Nutting, DO [address]  [address] [place],  [place] [address]  HISTORICAL INFORMATION:   Selected notes from the MEDICAL RECORD NUMBER       CURRENT MEDICATIONS: Current Outpatient Medications (Ophthalmic Drugs)  Medication Sig   Bromfenac Sodium 0.07 % SOLN Place 1 drop into the left eye 2 (two) times daily.   No current facility-administered medications for this visit. (Ophthalmic Drugs)   Current Outpatient Medications (Other)  Medication Sig   Ferrous Sulfate (IRON PO) Take 1 tablet by mouth as needed.    triamcinolone ointment (KENALOG) 0.1 % Apply 1 application topically 2 (two) times daily. To affected areas   Current Facility-Administered Medications (Other)  Medication Route   sulindac (CLINORIL) tablet 150 mg Oral      REVIEW OF SYSTEMS:    ALLERGIES Allergies  Allergen Reactions   Augmentin [Amoxicillin-Pot Clavulanate]     Causes thrush   Clindamycin/Lincomycin     PAST MEDICAL HISTORY Past Medical History:  Diagnosis Date   Dental disease    having implants d/t tooth growing up into sinuses   Heart valve disorder    "leaking"   Hypertension    Past Surgical History:  Procedure Laterality Date   COLONOSCOPY      FAMILY HISTORY Family History  Problem Relation Age of Onset   Cancer Mother        bladder cancer   Cancer Father        colon cancer   Colon cancer Father 80   Esophageal cancer Neg Hx    Rectal cancer Neg Hx    Stomach cancer Neg Hx     SOCIAL HISTORY Social History   Tobacco Use   Smoking status: Never   Smokeless tobacco: Never  Vaping Use   Vaping Use: Never  used  Substance Use Topics   Alcohol use: No    Alcohol/week: 0.0 standard drinks   Drug use: No         OPHTHALMIC EXAM:  Base Eye Exam     Visual Acuity (ETDRS)       Right Left   Dist [place] 20/25 20/50   Dist ph [place] NI NI         Tonometry (Tonopen, 9:48 AM)       Right Left   Pressure 17 11         Pupils       React   Right Minimal   Left Minimal  Pupils remain dilated, no reaction, stable for years        Neuro/Psych     Oriented x3: Yes   Mood/Affect: Normal           Slit Lamp and Fundus Exam     External Exam       Right Left   External Normal Normal         Slit Lamp Exam       Right Left   Lids/Lashes Normal Normal   Conjunctiva/Sclera White and quiet White and  quiet   Cornea Clear Clear   Anterior Chamber Deep and quiet Deep and quiet   Iris Round and reactive, brown, diffuse  transillumination defects 360 Round and reactive, brown, diffuse  transillumination defects 360   Lens 3+ Nuclear sclerosis Centered posterior chamber intraocular lens   Anterior Vitreous Normal Normal         Fundus Exam       Right Left   Posterior Vitreous Normal Normal   Disc Normal Normal   C/D Ratio 0.45 0.25   Macula Normal Cystoid macular edema   Vessels Normal Normal   Periphery Normal Normal            IMAGING AND PROCEDURES  Imaging and Procedures for 03/18/21           ASSESSMENT/PLAN:  Cystoid macular edema of left eye OS, with recurrent CME now been using Prolensa twice daily and more recently once daily to resolve the CME with initially with 20/200 vision last visit and now with 20/50.  Will likely asked patient to continue with ketorolac 3 times daily long-term and may need chronic use of ketorolac twice daily to prevent recurrence of CME left eye  Nuclear sclerotic cataract of right eye Follow-up with Dr. Duanne Guess as scheduled     ICD-10-CM   1. Cystoid macular edema of left eye  H35.352     2.  Nuclear sclerotic cataract of right eye  H25.11       1.  OS vastly improved now on topical NSAID.  We will use topical ketorolac 4 times daily for 1 month followed thereafter by long-term use of ketorolac twice daily to the left eye twice daily.  2.  Follow-up in 3 months OCT OU  3.  Ophthalmic Meds Ordered this visit:  No orders of the defined types were placed in this encounter.      No follow-ups on file.  There are no Patient Instructions on file for this visit.   Explained the diagnoses, plan, and follow up with the patient and they expressed understanding.  Patient expressed understanding of the importance of proper follow up care.   Clent Demark Manveer Gomes M.D. Diseases & Surgery of the Retina and Vitreous Retina & Diabetic [place] 03/18/21     Abbreviations: M myopia (nearsighted); A astigmatism; H hyperopia (farsighted); P presbyopia; Mrx spectacle prescription;  CTL contact lenses; OD right eye; OS left eye; OU both eyes  XT exotropia; ET esotropia; PEK punctate epithelial keratitis; PEE punctate epithelial erosions; DES dry eye syndrome; MGD meibomian gland dysfunction; ATs artificial tears; PFAT's preservative free artificial tears; [place] nuclear sclerotic cataract; PSC posterior subcapsular cataract; ERM epi-retinal membrane; PVD posterior vitreous detachment; RD retinal detachment; DM diabetes mellitus; DR diabetic retinopathy; NPDR non-proliferative diabetic retinopathy; PDR proliferative diabetic retinopathy; CSME clinically significant macular edema; DME diabetic macular edema; dbh dot blot hemorrhages; CWS cotton wool spot; POAG primary open angle glaucoma; C/D cup-to-disc ratio; HVF humphrey visual field; GVF goldmann visual field; OCT optical coherence tomography; IOP intraocular pressure; BRVO Branch retinal vein occlusion; CRVO central retinal vein occlusion; CRAO central retinal artery occlusion; BRAO branch retinal artery occlusion; RT retinal tear; SB scleral buckle;  PPV pars plana vitrectomy; VH Vitreous hemorrhage; PRP panretinal laser photocoagulation; IVK intravitreal kenalog; VMT vitreomacular traction; MH Macular hole;  NVD neovascularization of the disc; NVE neovascularization elsewhere; AREDS age related eye disease study; ARMD age related macular degeneration; POAG primary open angle glaucoma; EBMD epithelial/anterior basement membrane dystrophy; ACIOL anterior chamber intraocular lens;  IOL intraocular lens; PCIOL posterior chamber intraocular lens; Phaco/IOL phacoemulsification with intraocular lens placement; [place] photorefractive keratectomy; LASIK laser assisted in situ keratomileusis; HTN hypertension; DM diabetes mellitus; COPD chronic obstructive pulmonary disease

## 2021-03-18 NOTE — Assessment & Plan Note (Addendum)
OS, with recurrent CME now been using Prolensa twice daily and more recently once daily to resolve the CME with initially with 20/200 vision last visit and now with 20/50.  Will likely asked patient to continue with ketorolac 4  times daily for 1 month and then long-term and may need chronic use of ketorolac twice daily to prevent recurrence of CME left eye  Notably observe the patient using a Kleenex and she used significant compression of the globe to the right and to the left eye.  I asked her not to perform this maneuver as it is likely to increase chafing of the lens in the iris in the left eye increasing and enhancing the risk of CME

## 2021-03-18 NOTE — Assessment & Plan Note (Signed)
No active CME OD

## 2021-06-15 ENCOUNTER — Ambulatory Visit: Payer: Medicare Other | Admitting: Obstetrics and Gynecology

## 2021-06-21 ENCOUNTER — Other Ambulatory Visit: Payer: Self-pay

## 2021-06-21 ENCOUNTER — Encounter (INDEPENDENT_AMBULATORY_CARE_PROVIDER_SITE_OTHER): Payer: Self-pay | Admitting: Ophthalmology

## 2021-06-21 ENCOUNTER — Ambulatory Visit (INDEPENDENT_AMBULATORY_CARE_PROVIDER_SITE_OTHER): Payer: Medicare Other | Admitting: Ophthalmology

## 2021-06-21 DIAGNOSIS — H2511 Age-related nuclear cataract, right eye: Secondary | ICD-10-CM | POA: Diagnosis not present

## 2021-06-21 DIAGNOSIS — H35351 Cystoid macular degeneration, right eye: Secondary | ICD-10-CM

## 2021-06-21 DIAGNOSIS — H35352 Cystoid macular degeneration, left eye: Secondary | ICD-10-CM

## 2021-06-21 MED ORDER — KETOROLAC TROMETHAMINE 0.5 % OP SOLN: 1.0000 [drp] | Freq: Two times a day (BID) | OPHTHALMIC | 4 refills | Status: AC

## 2021-06-21 NOTE — Assessment & Plan Note (Signed)
Progressive cataract OD

## 2021-06-21 NOTE — Assessment & Plan Note (Addendum)
OS, CME resolution maintained on chronic twice daily uses of topical NSAID, ketorolac  CME OS resolved and yet also maintained resolution at use of topical NSAID, ketorolac twice daily will continue to use this chronically OS

## 2021-06-21 NOTE — Assessment & Plan Note (Signed)
None seen

## 2021-06-21 NOTE — Progress Notes (Signed)
06/21/2021     CHIEF COMPLAINT Patient presents for  Chief Complaint  Patient presents with   Retina Follow Up    WIP- CME OS- referred by Dr. Shirleen Schirmer Pt states, "I had cataract surgery in my left eye in April and it seemed to go ok. About a week ago I noticed I was not seeing well in my left eye. I went to Dr. Katy Fitch and he told me that I have the same swelling in the back of my eye that I had before."       HISTORY OF PRESENT ILLNESS: Melissa Russell is a 65 y.o. female who presents to the clinic today for:   HPI     Retina Follow Up   Patient presents with  Other.  In left eye.  This started 3 months ago.  Severity is mild.  Duration of 3 months.  Since onset it is gradually worsening. Additional comments: WIP- CME OS- referred by Dr. Shirleen Schirmer Pt states, "I had cataract surgery in my left eye in April and it seemed to go ok. About a week ago I noticed I was not seeing well in my left eye. I went to Dr. Katy Fitch and he told me that I have the same swelling in the back of my eye that I had before."         Comments   3 mos fu OU oct no dilation. Patient states vision is stable and unchanged since last visit. Denies any new floaters or FOL. Pt uses ketorolac OS bid.       Last edited by Laurin Coder on 06/21/2021 10:49 AM.      Referring physician: Luetta Nutting, DO [address]  [address] [place],  [place] [address]  HISTORICAL INFORMATION:   Selected notes from the MEDICAL RECORD NUMBER       CURRENT MEDICATIONS: Current Outpatient Medications (Ophthalmic Drugs)  Medication Sig   ketorolac (ACULAR) 0.5 % ophthalmic solution Place 1 drop into the left eye 4 (four) times daily.   No current facility-administered medications for this visit. (Ophthalmic Drugs)   Current Outpatient Medications (Other)  Medication Sig   Ferrous Sulfate (IRON PO) Take 1 tablet by mouth as needed.    triamcinolone ointment (KENALOG) 0.1 % Apply 1 application  topically 2 (two) times daily. To affected areas   No current facility-administered medications for this visit. (Other)      REVIEW OF SYSTEMS:    ALLERGIES Allergies  Allergen Reactions   Augmentin [Amoxicillin-Pot Clavulanate]     Causes thrush   Clindamycin/Lincomycin     PAST MEDICAL HISTORY Past Medical History:  Diagnosis Date   Dental disease    having implants d/t tooth growing up into sinuses   Heart valve disorder    "leaking"   Hypertension    Past Surgical History:  Procedure Laterality Date   COLONOSCOPY      FAMILY HISTORY Family History  Problem Relation Age of Onset   Cancer Mother        bladder cancer   Cancer Father        colon cancer   Colon cancer Father 36   Esophageal cancer Neg Hx    Rectal cancer Neg Hx    Stomach cancer Neg Hx     SOCIAL HISTORY Social History   Tobacco Use   Smoking status: Never   Smokeless tobacco: Never  Vaping Use   Vaping Use: Never used  Substance Use  Topics   Alcohol use: No    Alcohol/week: 0.0 standard drinks   Drug use: No         OPHTHALMIC EXAM:  Base Eye Exam     Visual Acuity (ETDRS)       Right Left   Dist [place] 20/25 -1 20/50 -1   Dist ph [place] NI NI         Tonometry (Tonopen, 10:52 AM)       Right Left   Pressure 10 15         Pupils       Pupils Dark Light React APD   Right PERRL 6 6 Minimal None   Left PERRL 6 6 Minimal None         Extraocular Movement       Right Left    Full Full         Neuro/Psych     Oriented x3: Yes   Mood/Affect: Normal         Dilation     Both eyes: NO DILATION            Slit Lamp and Fundus Exam     External Exam       Right Left   External Normal Normal         Slit Lamp Exam       Right Left   Lids/Lashes Normal Normal   Conjunctiva/Sclera White and quiet White and quiet   Cornea Clear Clear   Anterior Chamber Deep and quiet Deep and quiet   Iris Round and reactive, brown, diffuse   transillumination defects 360 Round and reactive, brown, diffuse  transillumination defects 360   Lens 3+ Nuclear sclerosis Centered posterior chamber intraocular lens   Anterior Vitreous Normal Normal         Fundus Exam       Right Left   Posterior Vitreous Normal Normal   Disc Normal Normal   C/D Ratio 0.45 0.25   Macula Normal Normal, CME resolved   Vessels Normal Normal   Periphery Normal Normal            IMAGING AND PROCEDURES  Imaging and Procedures for 06/21/21  OCT, Retina - OU - Both Eyes       Right Eye Quality was good. Scan locations included subfoveal. Central Foveal Thickness: 317. Progression has been stable. Findings include normal foveal contour.   Left Eye Quality was good. Scan locations included subfoveal. Central Foveal Thickness: 342. Progression has been stable. Findings include normal foveal contour.   Notes CME OS resolved and yet also maintained resolution at use of topical NSAID, ketorolac twice daily will continue to use this chronically OS              ASSESSMENT/PLAN:  Cystoid macular edema of left eye OS, CME resolution maintained on chronic twice daily uses of topical NSAID, ketorolac  CME OS resolved and yet also maintained resolution at use of topical NSAID, ketorolac twice daily will continue to use this chronically OS  Cystoid macular edema of right eye None seen  Nuclear sclerotic cataract of right eye Progressive cataract OD     ICD-10-CM   1. Cystoid macular edema of left eye  H35.352 OCT, Retina - OU - Both Eyes    2. Cystoid macular edema of right eye  H35.351     3. Nuclear sclerotic cataract of right eye  H25.11       1.  2.  3.  Ophthalmic Meds Ordered this visit:  No orders of the defined types were placed in this encounter.      Return in about 6 months (around 12/20/2021) for DILATE OU, OCT.  There are no Patient Instructions on file for this visit.   Explained the diagnoses, plan, and  follow up with the patient and they expressed understanding.  Patient expressed understanding of the importance of proper follow up care.   Clent Demark Anees Vanecek M.D. Diseases & Surgery of the Retina and Vitreous Retina & Diabetic [place] 06/21/21     Abbreviations: M myopia (nearsighted); A astigmatism; H hyperopia (farsighted); P presbyopia; Mrx spectacle prescription;  CTL contact lenses; OD right eye; OS left eye; OU both eyes  XT exotropia; ET esotropia; PEK punctate epithelial keratitis; PEE punctate epithelial erosions; DES dry eye syndrome; MGD meibomian gland dysfunction; ATs artificial tears; PFAT's preservative free artificial tears; [place] nuclear sclerotic cataract; PSC posterior subcapsular cataract; ERM epi-retinal membrane; PVD posterior vitreous detachment; RD retinal detachment; DM diabetes mellitus; DR diabetic retinopathy; NPDR non-proliferative diabetic retinopathy; PDR proliferative diabetic retinopathy; CSME clinically significant macular edema; DME diabetic macular edema; dbh dot blot hemorrhages; CWS cotton wool spot; POAG primary open angle glaucoma; C/D cup-to-disc ratio; HVF humphrey visual field; GVF goldmann visual field; OCT optical coherence tomography; IOP intraocular pressure; BRVO Branch retinal vein occlusion; CRVO central retinal vein occlusion; CRAO central retinal artery occlusion; BRAO branch retinal artery occlusion; RT retinal tear; SB scleral buckle; PPV pars plana vitrectomy; VH Vitreous hemorrhage; PRP panretinal laser photocoagulation; IVK intravitreal kenalog; VMT vitreomacular traction; MH Macular hole;  NVD neovascularization of the disc; NVE neovascularization elsewhere; AREDS age related eye disease study; ARMD age related macular degeneration; POAG primary open angle glaucoma; EBMD epithelial/anterior basement membrane dystrophy; ACIOL anterior chamber intraocular lens; IOL intraocular lens; PCIOL posterior chamber intraocular lens; Phaco/IOL  phacoemulsification with intraocular lens placement; [place] photorefractive keratectomy; LASIK laser assisted in situ keratomileusis; HTN hypertension; DM diabetes mellitus; COPD chronic obstructive pulmonary disease

## 2021-07-14 ENCOUNTER — Encounter: Payer: Self-pay | Admitting: Internal Medicine

## 2021-07-20 ENCOUNTER — Encounter: Payer: Self-pay | Admitting: Physician Assistant

## 2021-07-20 ENCOUNTER — Ambulatory Visit (INDEPENDENT_AMBULATORY_CARE_PROVIDER_SITE_OTHER): Payer: Medicare Other

## 2021-07-20 ENCOUNTER — Ambulatory Visit (INDEPENDENT_AMBULATORY_CARE_PROVIDER_SITE_OTHER): Payer: Medicare Other | Admitting: Physician Assistant

## 2021-07-20 ENCOUNTER — Other Ambulatory Visit: Payer: Self-pay

## 2021-07-20 VITALS — BP 159/79 | HR 114 | Temp 98.0°F | Ht 65.5 in | Wt 119.0 lb

## 2021-07-20 DIAGNOSIS — Z13220 Encounter for screening for lipoid disorders: Secondary | ICD-10-CM | POA: Diagnosis not present

## 2021-07-20 DIAGNOSIS — R152 Fecal urgency: Secondary | ICD-10-CM

## 2021-07-20 DIAGNOSIS — Z131 Encounter for screening for diabetes mellitus: Secondary | ICD-10-CM

## 2021-07-20 DIAGNOSIS — R195 Other fecal abnormalities: Secondary | ICD-10-CM | POA: Diagnosis not present

## 2021-07-20 DIAGNOSIS — K5909 Other constipation: Secondary | ICD-10-CM | POA: Diagnosis not present

## 2021-07-20 DIAGNOSIS — Z1329 Encounter for screening for other suspected endocrine disorder: Secondary | ICD-10-CM

## 2021-07-20 NOTE — Patient Instructions (Addendum)
Start probiotic daily.  Stay hydrated.   Constipation, Adult Constipation is when a person has fewer than three bowel movements in a week, has difficulty having a bowel movement, or has stools (feces) that are dry, hard, or larger than normal. Constipation may be caused by an underlying condition. It may become worse with age if a person takes certain medicines and does not take in enough fluids. Follow these instructions at home: Eating and drinking  Eat foods that have a lot of fiber, such as beans, whole grains, and fresh fruits and vegetables. Limit foods that are low in fiber and high in fat and processed sugars, such as fried or sweet foods. These include french fries, hamburgers, cookies, candies, and soda. Drink enough fluid to keep your urine pale yellow. General instructions Exercise regularly or as told by your health care provider. Try to do 150 minutes of moderate exercise each week. Use the bathroom when you have the urge to go. Do not hold it in. Take over-the-counter and prescription medicines only as told by your health care provider. This includes any fiber supplements. During bowel movements: Practice deep breathing while relaxing the lower abdomen. Practice pelvic floor relaxation. Watch your condition for any changes. Let your health care provider know about them. Keep all follow-up visits as told by your health care provider. This is important. Contact a health care provider if: You have pain that gets worse. You have a fever. You do not have a bowel movement after 4 days. You vomit. You are not hungry or you lose weight. You are bleeding from the opening between the buttocks (anus). You have thin, pencil-like stools. Get help right away if: You have a fever and your symptoms suddenly get worse. You leak stool or have blood in your stool. Your abdomen is bloated. You have severe pain in your abdomen. You feel dizzy or you faint. Summary Constipation is when a  person has fewer than three bowel movements in a week, has difficulty having a bowel movement, or has stools (feces) that are dry, hard, or larger than normal. Eat foods that have a lot of fiber, such as beans, whole grains, and fresh fruits and vegetables. Drink enough fluid to keep your urine pale yellow. Take over-the-counter and prescription medicines only as told by your health care provider. This includes any fiber supplements. This information is not intended to replace advice given to you by your health care provider. Make sure you discuss any questions you have with your health care provider. Document Revised: 07/03/2019 Document Reviewed: 07/03/2019 Elsevier Patient Education  [employer].   Diarrhea, Adult Diarrhea is frequent loose and watery bowel movements. Diarrhea can make you feel weak and cause you to become dehydrated. Dehydration can make you tired and thirsty, cause you to have a dry mouth, and decrease how often you urinate. Diarrhea typically lasts 2-3 days. However, it can last longer if it is a sign of something more serious. It is important to treat your diarrhea as told by your health care provider. Follow these instructions at home: Eating and drinking   Follow these recommendations as told by your health care provider: Take an oral rehydration solution (ORS). This is an over-the-counter medicine that helps return your body to its normal balance of nutrients and water. It is found at pharmacies and retail stores. Drink plenty of fluids, such as water, ice chips, diluted fruit juice, and low-calorie sports drinks. You can drink milk also, if desired. Avoid drinking fluids  that contain a lot of sugar or caffeine, such as energy drinks, sports drinks, and soda. Eat bland, easy-to-digest foods in small amounts as you are able. These foods include bananas, applesauce, rice, lean meats, toast, and crackers. Avoid alcohol. Avoid spicy or fatty  foods.  Medicines Take over-the-counter and prescription medicines only as told by your health care provider. If you were prescribed an antibiotic medicine, take it as told by your health care provider. Do not stop using the antibiotic even if you start to feel better. General instructions  Wash your hands often using soap and water. If soap and water are not available, use a hand sanitizer. Others in the household should wash their hands as well. Hands should be washed: After using the toilet or changing a diaper. Before preparing, cooking, or serving food. While caring for a sick person or while visiting someone in a hospital. Drink enough fluid to keep your urine pale yellow. Rest at home while you recover. Watch your condition for any changes. Take a warm bath to relieve any burning or pain from frequent diarrhea episodes. Keep all follow-up visits as told by your health care provider. This is important. Contact a health care provider if: You have a fever. Your diarrhea gets worse. You have new symptoms. You cannot keep fluids down. You feel light-headed or dizzy. You have a headache. You have muscle cramps. Get help right away if: You have chest pain. You feel extremely weak or you faint. You have bloody or black stools or stools that look like tar. You have severe pain, cramping, or bloating in your abdomen. You have trouble breathing or you are breathing very quickly. Your heart is beating very quickly. Your skin feels cold and clammy. You feel confused. You have signs of dehydration, such as: Dark urine, very little urine, or no urine. Cracked lips. Dry mouth. Sunken eyes. Sleepiness. Weakness. Summary Diarrhea is frequent loose and sometimes watery bowel movements. Diarrhea can make you feel weak and cause you to become dehydrated. Drink enough fluids to keep your urine pale yellow. Make sure that you wash your hands after using the toilet. If soap and water are  not available, use hand sanitizer. Contact a health care provider if your diarrhea gets worse or you have new symptoms. Get help right away if you have signs of dehydration. This information is not intended to replace advice given to you by your health care provider. Make sure you discuss any questions you have with your health care provider. Document Revised: 02/24/2021 Document Reviewed: 02/24/2021 Elsevier Patient Education  [employer].

## 2021-07-20 NOTE — Progress Notes (Signed)
Subjective:    Patient ID: Melissa Russell, female    DOB: 06-04-1956, 65 y.o.   MRN: [id number]  HPI 65 y.o female presenting with alternating loose stools and constipation for the past 5 days. Pt states that she has one loose brown stool per day and then will experience bouts of constipation for up to two days. She is also experiencing intermittent, crampy suprapubic pain that does not radiate. States she experienced similar symptoms in 2019 that self-resolved within a few days. Both times she states she had a few days of crampy abdominal pain that she believes is a "warning". She has been under increased stress due to a recent move but denies any dietary or medication changes. Denies sick contacts.  Positive for fecal urgency but denies decreased appetite, fever, chills, dizziness, nausea, vomiting, hematochezia, or urinary changes.  .. Active Ambulatory Problems    Diagnosis Date Noted   Elevated blood pressure reading in office with white coat syndrome, without diagnosis of hypertension 12/14/2006   TACHYCARDIA 06/22/2010   ECHOCARDIOGRAM, ABNORMAL 12/14/2006   History of iron deficiency anemia 02/20/2020   Impacted cerumen of right ear 02/20/2020   Cystoid macular edema of left eye 06/02/2020   Retinal vasculitis of both eyes 06/02/2020   Posterior vitreous detachment of both eyes 06/02/2020   Cystoid macular edema of right eye 06/02/2020   Nuclear sclerotic cataract of right eye 06/02/2020   Screening examination for pulmonary tuberculosis 12/09/2020   Pseudophakia of left eye 02/11/2021   Vitreomacular adhesion of both eyes 02/11/2021   Resolved Ambulatory Problems    Diagnosis Date Noted   No Resolved Ambulatory Problems   Past Medical History:  Diagnosis Date   Dental disease    Heart valve disorder    Hypertension       Review of Systems See HPI.    Objective:   Physical Exam Vitals reviewed.  Constitutional:      Appearance: Normal appearance.  HENT:      Head: Normocephalic.  Cardiovascular:     Rate and Rhythm: Normal rate.     Pulses: Normal pulses.  Pulmonary:     Effort: Pulmonary effort is normal.     Breath sounds: Normal breath sounds.  Abdominal:     General: There is distension.     Palpations: Abdomen is soft. There is no mass.     Tenderness: There is no abdominal tenderness. There is no right CVA tenderness, left CVA tenderness, guarding or rebound.     Hernia: No hernia is present.     Comments: Hyperactive bowel sounds in the upper right and left abdomen.  Musculoskeletal:     Right lower leg: No edema.     Left lower leg: No edema.  Neurological:     Mental Status: She is alert.  Psychiatric:        Mood and Affect: Mood normal.          Assessment & Plan:  Jatziri was seen today for diarrhea and constipation.  Diagnoses and all orders for this visit:  Loose stools -     CBC with Differential/Platelet -     COMPLETE METABOLIC PANEL WITH GFR  Fecal urgency -     DG Abd 1 View; Future -     CBC with Differential/Platelet -     Lipid Panel w/reflex Direct LDL -     COMPLETE METABOLIC PANEL WITH GFR -     Fe+TIBC+Fer  Screening for lipid disorders -  Lipid Panel w/reflex Direct LDL  Screening for diabetes mellitus -     COMPLETE METABOLIC PANEL WITH GFR  Thyroid disorder screening  She does appear an little bloated.  No red flag abdominal signs or symptoms today.  Labs ordered.  X-ray abdomen ordered at today's visit. Recommended pt begin taking daily probiotics.  ?IBS mixed.  GI appt in December, needs colonoscopy.

## 2021-07-21 NOTE — Progress Notes (Signed)
You do have scattered large and small bowel gas and mild amt of stool. No significant constipation. I want you to start with probiotic and see if that is all you need and then we can slowly add other things to keep your stools more normal.

## 2021-07-22 LAB — COMPLETE METABOLIC PANEL WITH GFR
AG Ratio: 1.8 (calc) (ref 1.0–2.5)
ALT: 16 U/L (ref 6–29)
AST: 18 U/L (ref 10–35)
Albumin: 4.2 g/dL (ref 3.6–5.1)
Alkaline phosphatase (APISO): 66 U/L (ref 37–153)
BUN: 10 mg/dL (ref 7–25)
CO2: 27 mmol/L (ref 20–32)
Calcium: 9.5 mg/dL (ref 8.6–10.4)
Chloride: 104 mmol/L (ref 98–110)
Creat: 0.57 mg/dL (ref 0.50–1.05)
Globulin: 2.4 g/dL (calc) (ref 1.9–3.7)
Glucose, Bld: 92 mg/dL (ref 65–99)
Potassium: 4.2 mmol/L (ref 3.5–5.3)
Sodium: 140 mmol/L (ref 135–146)
Total Bilirubin: 0.7 mg/dL (ref 0.2–1.2)
Total Protein: 6.6 g/dL (ref 6.1–8.1)
eGFR: 100 mL/min/{1.73_m2} (ref >=60)

## 2021-07-22 LAB — LIPID PANEL W/REFLEX DIRECT LDL
Cholesterol: 207 mg/dL — ABNORMAL HIGH (ref <200)
HDL: 79 mg/dL (ref >=50)
LDL Cholesterol (Calc): 113 mg/dL (calc) — ABNORMAL HIGH
Non-HDL Cholesterol (Calc): 128 mg/dL (calc) (ref <130)
Total CHOL/HDL Ratio: 2.6 (calc) (ref <5.0)
Triglycerides: 65 mg/dL (ref <150)

## 2021-07-22 LAB — CBC WITH DIFFERENTIAL/PLATELET
Absolute Monocytes: 312 cells/uL (ref 200–950)
Basophils Absolute: 60 cells/uL (ref 0–200)
Basophils Relative: 1.5 %
Eosinophils Absolute: 204 cells/uL (ref 15–500)
Eosinophils Relative: 5.1 %
HCT: 41.1 % (ref 35.0–45.0)
Hemoglobin: 14 g/dL (ref 11.7–15.5)
Lymphs Abs: 2000 cells/uL (ref 850–3900)
MCH: 29 pg (ref 27.0–33.0)
MCHC: 34.1 g/dL (ref 32.0–36.0)
MCV: 85.3 fL (ref 80.0–100.0)
MPV: 9.8 fL (ref 7.5–12.5)
Monocytes Relative: 7.8 %
Neutro Abs: 1424 cells/uL — ABNORMAL LOW (ref 1500–7800)
Neutrophils Relative %: 35.6 %
Platelets: 216 10*3/uL (ref 140–400)
RBC: 4.82 10*6/uL (ref 3.80–5.10)
RDW: 12.8 % (ref 11.0–15.0)
Total Lymphocyte: 50 %
WBC: 4 10*3/uL (ref 3.8–10.8)

## 2021-07-22 LAB — IRON,TIBC AND FERRITIN PANEL
%SAT: 41 % (calc) (ref 16–45)
Ferritin: 57 ng/mL (ref 16–288)
Iron: 118 ug/dL (ref 45–160)
TIBC: 289 mcg/dL (calc) (ref 250–450)

## 2021-07-26 NOTE — Progress Notes (Signed)
Iron levels and iron storage looks good.  Kidney, liver, glucose look great.  HDL, good cholesterol, looks GREAT.  LDL, bad cholesterol, not optimal but overall looks good.

## 2021-08-16 ENCOUNTER — Ambulatory Visit (INDEPENDENT_AMBULATORY_CARE_PROVIDER_SITE_OTHER): Payer: Medicare Other | Admitting: Family Medicine

## 2021-08-16 DIAGNOSIS — Z0000 Encounter for general adult medical examination without abnormal findings: Secondary | ICD-10-CM | POA: Diagnosis not present

## 2021-08-16 NOTE — Patient Instructions (Signed)
[place] Maintenance Summary and Written Plan of Care  Melissa Russell ,  Thank you for allowing me to perform your Medicare Annual Wellness Visit and for your ongoing commitment to your health.   Health Maintenance & Immunization History Health Maintenance  Topic Date Due   COVID-19 Vaccine (3 - Pfizer risk series) 09/01/2021 (Originally 10/26/2019)   Zoster Vaccines- Shingrix (1 of 2) 10/20/2021 (Originally 01/29/1973)   INFLUENZA VACCINE  11/26/2021 (Originally 03/29/2021)   Pneumonia Vaccine 84+ Years old (1 - PCV) 07/20/2022 (Originally 01/30/1960)   DEXA SCAN  07/20/2022 (Originally 01/30/2019)   COLONOSCOPY (Pts 45-85yrs Insurance coverage will need to be confirmed)  07/20/2022 (Originally 07/12/2021)   Hepatitis C Screening  07/20/2022 (Originally 01/30/1972)   MAMMOGRAM  02/27/2023   TETANUS/TDAP  04/18/2026   HPV VACCINES  Aged Out   Immunization History  Administered Date(s) Administered   PFIZER(Purple Top)SARS-COV-2 Vaccination 09/09/2019, 09/28/2019   Td 01/02/2006   Tdap 04/18/2016    These are the patient goals that we discussed:  Goals Addressed               This Visit's Progress     Patient Stated (pt-stated)        Work on her medical advance directives.         This is a list of Health Maintenance Items that are overdue or due now: Pneumococcal vaccine  Influenza vaccine Bone densitometry screening Colorectal cancer screening Shingrix vaccine  Patient declined all of the vaccines at this time.  She is scheduled for the colonoscopy in January, 2023.  She will discuss the bone density with her GYN at her next appointment in January, 2023.    Orders/Referrals Placed Today: No orders of the defined types were placed in this encounter.  (Contact our referral department at [phone] if you have not spoken with someone about your referral appointment within the next 5 days)    Follow-up Plan Follow-up with Luetta Nutting,  DO as planned Please have your records from the colonoscopy and bone density faxed over. Medicare wellness visit in one year.  Patient will access AVS on mychart.      Health Maintenance, Female Adopting a healthy lifestyle and getting preventive care are important in promoting health and wellness. Ask your health care provider about: The right schedule for you to have regular tests and exams. Things you can do on your own to prevent diseases and keep yourself healthy. What should I know about diet, weight, and exercise? Eat a healthy diet  Eat a diet that includes plenty of vegetables, fruits, low-fat dairy products, and lean protein. Do not eat a lot of foods that are high in solid fats, added sugars, or sodium. Maintain a healthy weight Body mass index (BMI) is used to identify weight problems. It estimates body fat based on height and weight. Your health care provider can help determine your BMI and help you achieve or maintain a healthy weight. Get regular exercise Get regular exercise. This is one of the most important things you can do for your health. Most adults should: Exercise for at least 150 minutes each week. The exercise should increase your heart rate and make you sweat (moderate-intensity exercise). Do strengthening exercises at least twice a week. This is in addition to the moderate-intensity exercise. Spend less time sitting. Even light physical activity can be beneficial. Watch cholesterol and blood lipids Have your blood tested for lipids and cholesterol at 67 years of age, then have  this test every 5 years. Have your cholesterol levels checked more often if: Your lipid or cholesterol levels are high. You are older than 67 years of age. You are at high risk for heart disease. What should I know about cancer screening? Depending on your health history and family history, you may need to have cancer screening at various ages. This may include screening for: Breast  cancer. Cervical cancer. Colorectal cancer. Skin cancer. Lung cancer. What should I know about heart disease, diabetes, and high blood pressure? Blood pressure and heart disease High blood pressure causes heart disease and increases the risk of stroke. This is more likely to develop in people who have high blood pressure readings or are overweight. Have your blood pressure checked: Every 3-5 years if you are 30-18 years of age. Every year if you are 18 years old or older. Diabetes Have regular diabetes screenings. This checks your fasting blood sugar level. Have the screening done: Once every three years after age 23 if you are at a normal weight and have a low risk for diabetes. More often and at a younger age if you are overweight or have a high risk for diabetes. What should I know about preventing infection? Hepatitis B If you have a higher risk for hepatitis B, you should be screened for this virus. Talk with your health care provider to find out if you are at risk for hepatitis B infection. Hepatitis C Testing is recommended for: Everyone born from [birth year] through 1967. Anyone with known risk factors for hepatitis C. Sexually transmitted infections (STIs) Get screened for STIs, including gonorrhea and chlamydia, if: You are sexually active and are younger than 67 years of age. You are older than 67 years of age and your health care provider tells you that you are at risk for this type of infection. Your sexual activity has changed since you were last screened, and you are at increased risk for chlamydia or gonorrhea. Ask your health care provider if you are at risk. Ask your health care provider about whether you are at high risk for HIV. Your health care provider may recommend a prescription medicine to help prevent HIV infection. If you choose to take medicine to prevent HIV, you should first get tested for HIV. You should then be tested every 3 months for as long as you are taking  the medicine. Pregnancy If you are about to stop having your period (premenopausal) and you may become pregnant, seek counseling before you get pregnant. Take 400 to 800 micrograms (mcg) of folic acid every day if you become pregnant. Ask for birth control (contraception) if you want to prevent pregnancy. Osteoporosis and menopause Osteoporosis is a disease in which the bones lose minerals and strength with aging. This can result in bone fractures. If you are 55 years old or older, or if you are at risk for osteoporosis and fractures, ask your health care provider if you should: Be screened for bone loss. Take a calcium or vitamin D supplement to lower your risk of fractures. Be given hormone replacement therapy (HRT) to treat symptoms of menopause. Follow these instructions at home: Alcohol use Do not drink alcohol if: Your health care provider tells you not to drink. You are pregnant, may be pregnant, or are planning to become pregnant. If you drink alcohol: Limit how much you have to: 0-1 drink a day. Know how much alcohol is in your drink. In the [place] one drink equals one 12 oz bottle of  beer (355 mL), one 5 oz glass of wine (148 mL), or one 1 oz glass of hard liquor (44 mL). Lifestyle Do not use any products that contain nicotine or tobacco. These products include cigarettes, chewing tobacco, and vaping devices, such as e-cigarettes. If you need help quitting, ask your health care provider. Do not use street drugs. Do not share needles. Ask your health care provider for help if you need support or information about quitting drugs. General instructions Schedule regular health, dental, and eye exams. Stay current with your vaccines. Tell your health care provider if: You often feel depressed. You have ever been abused or do not feel safe at home. Summary Adopting a healthy lifestyle and getting preventive care are important in promoting health and wellness. Follow your health  care provider's instructions about healthy diet, exercising, and getting tested or screened for diseases. Follow your health care provider's instructions on monitoring your cholesterol and blood pressure. This information is not intended to replace advice given to you by your health care provider. Make sure you discuss any questions you have with your health care provider. Document Revised: 01/04/2021 Document Reviewed: 01/04/2021 Elsevier Patient Education  [place].

## 2021-08-16 NOTE — Progress Notes (Signed)
MEDICARE ANNUAL WELLNESS VISIT  08/16/2021  Telephone Visit Disclaimer This Medicare AWV was conducted by telephone due to national recommendations for restrictions regarding the COVID-19 Pandemic (e.g. social distancing).  I verified, using two identifiers, that I am speaking with Melissa Russell or their authorized healthcare agent. I discussed the limitations, risks, security, and privacy concerns of performing an evaluation and management service by telephone and the potential availability of an in-person appointment in the future. The patient expressed understanding and agreed to proceed.  Location of Patient: Home Location of Provider (nurse):  In the office.  Subjective:    Melissa Russell is a 65 y.o. female patient of Melissa Nutting, DO who had a Medicare Annual Wellness Visit today via telephone. Melissa Russell is Retired and lives with their spouse. she has 0 children. she reports that she is socially active and does interact with friends/family regularly. she is moderately physically active and enjoys reading, walking and jigsaw puzzles.  Patient Care Team: Melissa Nutting, DO as PCP - General (Family Medicine) Melissa Cobbs, MD as Consulting Physician (Obstetrics and Gynecology)  Advanced Directives 08/16/2021 02/20/2020 07/12/2016 06/28/2016  Does Patient Have a Medical Advance Directive? No No Yes Yes  Type of Advance Directive - - Public librarian;Living will  Copy of [place] in Chart? - - - No - copy requested  Would patient like information on creating a medical advance directive? No - Patient declined No - Patient declined - [facility] Utilization Over the Past 12 Months: # of hospitalizations or ER visits: 0 # of surgeries: 0  Review of Systems    Patient reports that her overall health is unchanged compared to last year.  History obtained from chart review and the patient  Patient Reported Readings (BP, Pulse, CBG,  Weight, etc) none  Pain Assessment Pain : No/denies pain     Current Medications & Allergies (verified) Allergies as of 08/16/2021       Reactions   Augmentin [amoxicillin-pot Clavulanate]    Causes thrush   Clindamycin/lincomycin         Medication List        Accurate as of August 16, 2021 10:21 AM. If you have any questions, ask your nurse or doctor.          IRON PO Take 1 tablet by mouth as needed.   ketorolac 0.5 % ophthalmic solution Commonly known as: ACULAR Place 1 drop into the left eye 2 (two) times daily.        History (reviewed): Past Medical History:  Diagnosis Date   Dental disease    having implants d/t tooth growing up into sinuses   Heart valve disorder    "leaking"   Hypertension    Past Surgical History:  Procedure Laterality Date   COLONOSCOPY     Family History  Problem Relation Age of Onset   Cancer Mother        bladder cancer   Cancer Father        colon cancer   Colon cancer Father 42   Esophageal cancer Neg Hx    Rectal cancer Neg Hx    Stomach cancer Neg Hx    Social History   Socioeconomic History   Marital status: Married    Spouse name: Eddie Dibbles   Number of children: 0   Years of education: 18   Highest education level: Master's degree (e.g., MA, MS, MEng, MEd, MSW, MBA)  Occupational History   Occupation: Retired.  Tobacco Use   Smoking status: Never   Smokeless tobacco: Never  Vaping Use   Vaping Use: Never used  Substance and Sexual Activity   Alcohol use: No    Alcohol/week: 0.0 standard drinks   Drug use: No   Sexual activity: Not Currently    Partners: Male    Birth control/protection: Post-menopausal  Other Topics Concern   Not on file  Social History Narrative   Lives with her husband. She enjoys doing a lot of genealogy research, dancing, reading and fitness class.    Social Determinants of Health   Financial Resource Strain: Low Risk    Difficulty of Paying Living Expenses: Not hard  at all  Food Insecurity: No Food Insecurity   Worried About Charity fundraiser in the Last Year: Never true   [place] in the Last Year: Never true  Transportation Needs: No Transportation Needs   Lack of Transportation (Medical): No   Lack of Transportation (Non-Medical): No  Physical Activity: Sufficiently Active   Days of Exercise per Week: 7 days   Minutes of Exercise per Session: 50 min  Stress: No Stress Concern Present   Feeling of Stress : Not at all  Social Connections: Socially Integrated   Frequency of Communication with Friends and Family: More than three times a week   Frequency of Social Gatherings with Friends and Family: More than three times a week   Attends Religious Services: More than 4 times per year   Active Member of [employer] or Organizations: Yes   Attends Archivist Meetings: More than 4 times per year   Marital Status: Married    Activities of Daily Living In your present state of health, do you have any difficulty performing the following activities: 08/16/2021  Hearing? N  Vision? Y  Comment She has been having difficulty and is under treatment with an eye specialist.  Difficulty concentrating or making decisions? N  Walking or climbing stairs? N  Dressing or bathing? N  Doing errands, shopping? N  Preparing Food and eating ? N  Using the Toilet? N  In the past six months, have you accidently leaked urine? Y  Comment just once  Do you have problems with loss of bowel control? N  Managing your Medications? N  Managing your Finances? N  Housekeeping or managing your Housekeeping? N  Some recent data might be hidden    Patient Education/ Literacy How often do you need to have someone help you when you read instructions, pamphlets, or other written materials from your doctor or pharmacy?: 1 - Never What is the last grade level you completed in school?: Master's degree  Exercise Current Exercise Habits: Home exercise routine, Type of  exercise: walking;strength training/weights;stretching, Time (Minutes): 50, Frequency (Times/Week): 7, Weekly Exercise (Minutes/Week): 350, Intensity: Moderate, Exercise limited by: None identified  Diet Patient reports consuming 3 meals a day and 0 snack(s) a day Patient reports that her primary diet is: Regular Patient reports that she does have regular access to food.   Depression Screen PHQ 2/9 Scores 08/16/2021 02/20/2020  PHQ - 2 Score 0 0  PHQ- 9 Score - 0     Fall Risk Fall Risk  08/16/2021 12/07/2020 04/23/2020  Falls in the past year? 0 0 0  Number falls in past yr: 0 0 0  Injury with Fall? 0 0 0  Risk for fall due to : No Fall Risks No Fall Risks -  Follow up Falls evaluation completed Falls evaluation completed -     Objective:  Melissa Russell seemed alert and oriented and she participated appropriately during our telephone visit.  Blood Pressure Weight BMI  BP Readings from Last 3 Encounters:  07/20/21 (!) 159/79  01/18/21 (!) 152/87  12/07/20 (!) 182/83   Wt Readings from Last 3 Encounters:  07/20/21 119 lb (54 kg)  12/07/20 121 lb 1.6 oz (54.9 kg)  06/18/20 124 lb (56.2 kg)   BMI Readings from Last 1 Encounters:  07/20/21 19.50 kg/m    *Unable to obtain current vital signs, weight, and BMI due to telephone visit type  Hearing/Vision  Reeve did not seem to have difficulty with hearing/understanding during the telephone conversation Reports that she has had a formal eye exam by an eye care professional within the past year Reports that she has not had a formal hearing evaluation within the past year *Unable to fully assess hearing and vision during telephone visit type  Cognitive Function: 6CIT Screen 08/16/2021  What Year? 0 points  What month? 0 points  What time? 0 points  Count back from 20 0 points  Months in reverse 0 points  Repeat phrase 0 points  Total Score 0   (Normal:0-7, Significant for Dysfunction: >8)  Normal Cognitive Function  Screening: Yes   Immunization & Health Maintenance Record Immunization History  Administered Date(s) Administered   PFIZER(Purple Top)SARS-COV-2 Vaccination 09/09/2019, 09/28/2019   Td 01/02/2006   Tdap 04/18/2016    Health Maintenance  Topic Date Due   COVID-19 Vaccine (3 - Pfizer risk series) 09/01/2021 (Originally 10/26/2019)   Zoster Vaccines- Shingrix (1 of 2) 10/20/2021 (Originally 01/29/1973)   INFLUENZA VACCINE  11/26/2021 (Originally 03/29/2021)   Pneumonia Vaccine 5+ Years old (1 - PCV) 07/20/2022 (Originally 01/30/1960)   DEXA SCAN  07/20/2022 (Originally 01/30/2019)   COLONOSCOPY (Pts 45-92yrs Insurance coverage will need to be confirmed)  07/20/2022 (Originally 07/12/2021)   Hepatitis C Screening  07/20/2022 (Originally 01/30/1972)   MAMMOGRAM  02/27/2023   TETANUS/TDAP  04/18/2026   HPV VACCINES  Aged Out       Assessment  This is a routine wellness examination for Melissa Russell.  Health Maintenance: Due or Overdue There are no preventive care reminders to display for this patient.   Melissa Russell does not need a referral for Community Assistance: Care Management:   no Social Work:    no Prescription Assistance:  no Nutrition/Diabetes Education:  no   Plan:  Personalized Goals  Goals Addressed               This Visit's Progress     Patient Stated (pt-stated)        Work on her medical advance directives.       Personalized Health Maintenance & Screening Recommendations  Pneumococcal vaccine  Influenza vaccine Bone densitometry screening Colorectal cancer screening Shingrix vaccine  Patient declined all of the vaccines at this time.  She is scheduled for the colonoscopy in January, 2023.  She will discuss the bone density with her GYN at her next appointment in January, 2023.  Lung Cancer Screening Recommended: no (Low Dose CT Chest recommended if Age 57-80 years, 30 pack-year currently smoking OR have quit w/in past 15 years) Hepatitis C  Screening recommended: no HIV Screening recommended: no  Advanced Directives: Written information was not prepared per patient's request.  Referrals & Orders No orders of the defined types were placed in this encounter.   Follow-up Plan  Follow-up with Melissa Nutting, DO as planned Please have your records from the colonoscopy and bone density faxed over. Medicare wellness visit in one year.  Patient will access AVS on mychart.   I have personally reviewed and noted the following in the patients chart:   Medical and social history Use of alcohol, tobacco or illicit drugs  Current medications and supplements Functional ability and status Nutritional status Physical activity Advanced directives List of other physicians Hospitalizations, surgeries, and ER visits in previous 12 months Vitals Screenings to include cognitive, depression, and falls Referrals and appointments  In addition, I have reviewed and discussed with Melissa Russell certain preventive protocols, quality metrics, and best practice recommendations. A written personalized care plan for preventive services as well as general preventive health recommendations is available and can be mailed to the patient at her request.      Tinnie Gens, RN  08/16/2021

## 2021-08-17 ENCOUNTER — Ambulatory Visit: Payer: Medicare Other | Admitting: Obstetrics and Gynecology

## 2021-08-18 NOTE — Progress Notes (Deleted)
65 y.o. G0P0000 Married Caucasian female here for breast and pelvic exam.    PCP:     Patient's last menstrual period was 08/29/2008.           Sexually active: {yes no:314532}  The current method of family planning is post menopausal status.    Exercising: {yes no:314532}  {types:19826} Smoker:  no  Health Maintenance: Pap:   04-24-19 Neg, 01-27-16 Neg:Neg HR HPV, 07-18-13 Neg:Neg HR HPV History of abnormal Pap:  no MMG:  02-26-21 Rt.br.poss.distortion;Lt.br.neg. Diag.Rt.br.neg/BiRads1 Colonoscopy: *** 07/12/16 Normal. F/u 5 years  BMD:   ***  Result  *** TDaP:  2017 Gardasil:   no HIV: Neg 2001 Hep C: Neg 2001 Screening Labs:  Hb today: ***, Urine today: ***   reports that she has never smoked. She has never used smokeless tobacco. She reports that she does not drink alcohol and does not use drugs.  Past Medical History:  Diagnosis Date   Dental disease    having implants d/t tooth growing up into sinuses   Heart valve disorder    "leaking"   Hypertension     Past Surgical History:  Procedure Laterality Date   COLONOSCOPY      Current Outpatient Medications  Medication Sig Dispense Refill   Ferrous Sulfate (IRON PO) Take 1 tablet by mouth as needed.      ketorolac (ACULAR) 0.5 % ophthalmic solution Place 1 drop into the left eye 2 (two) times daily. 9 mL 4   No current facility-administered medications for this visit.    Family History  Problem Relation Age of Onset   Cancer Mother        bladder cancer   Cancer Father        colon cancer   Colon cancer Father 51   Esophageal cancer Neg Hx    Rectal cancer Neg Hx    Stomach cancer Neg Hx     Review of Systems  Exam:   LMP 08/29/2008     General appearance: alert, cooperative and appears stated age Head: normocephalic, without obvious abnormality, atraumatic Neck: no adenopathy, supple, symmetrical, trachea midline and thyroid normal to inspection and palpation Lungs: clear to auscultation  bilaterally Breasts: normal appearance, no masses or tenderness, No nipple retraction or dimpling, No nipple discharge or bleeding, No axillary adenopathy Heart: regular rate and rhythm Abdomen: soft, non-tender; no masses, no organomegaly Extremities: extremities normal, atraumatic, no cyanosis or edema Skin: skin color, texture, turgor normal. No rashes or lesions Lymph nodes: cervical, supraclavicular, and axillary nodes normal. Neurologic: grossly normal  Pelvic: External genitalia:  no lesions              No abnormal inguinal nodes palpated.              Urethra:  normal appearing urethra with no masses, tenderness or lesions              Bartholins and Skenes: normal                 Vagina: normal appearing vagina with normal color and discharge, no lesions              Cervix: no lesions              Pap taken: {yes no:314532} Bimanual Exam:  Uterus:  normal size, contour, position, consistency, mobility, non-tender              Adnexa: no mass, fullness, tenderness  Rectal exam: {yes no:314532}.  Confirms.              Anus:  normal sphincter tone, no lesions  Chaperone was present for exam:  ***  Assessment:   Well woman visit with gynecologic exam.   Plan: Mammogram screening discussed. Self breast awareness reviewed. Pap and HR HPV as above. Guidelines for Calcium, Vitamin D, regular exercise program including cardiovascular and weight bearing exercise.   Follow up annually and prn.   Additional counseling given.  {yes [id number]. _______ minutes face to face time of which over 50% was spent in counseling.    After visit summary provided.

## 2021-08-31 ENCOUNTER — Ambulatory Visit: Payer: Medicare Other | Admitting: Obstetrics and Gynecology

## 2021-09-10 ENCOUNTER — Encounter: Payer: Medicare Other | Admitting: Internal Medicine

## 2021-09-28 NOTE — Progress Notes (Signed)
66 y.o. Melissa Russell Married Caucasian female here for annual exam.    Had vaginal soreness for one month following her last pelvic exam.  She was noted to have atrophy.  Vaginitis testing was negative.  She does not have spontaneous dryness of discomfort.   Not taking supplements.   Having constipation.  Sometimes has some fecal urgency.   Moved to [place]. Back to square dancing.   PCP:   Luetta Nutting  Patient's last menstrual period was 08/29/2008.           Sexually active: No.  The current method of family planning is post menopausal status.    Exercising: Yes.     Exercise classes, walking, square dancing Smoker:  no  Health Maintenance: Pap:  04-24-19 neg, 01-27-16 neg HPV HR neg, 07-18-13 neg HPV HR neg History of abnormal Pap:  no MMG:  7/22 bilateral & right breast imaging category c density birads 1:neg Colonoscopy:  07-12-16 normal f/u 80yrs due to family history.  Has appt in February.  BMD:   none  Result  n/a TDaP:  2017 Gardasil:   no HIV: neg 2001 Hep C: neg 2001 Screening Labs:   PCP.   reports that she has never smoked. She has never used smokeless tobacco. She reports that she does not drink alcohol and does not use drugs.  Past Medical History:  Diagnosis Date   Dental disease    having implants d/t tooth growing up into sinuses   Heart valve disorder    "leaking"   Hypertension     Past Surgical History:  Procedure Laterality Date   cararact surgery Left    COLONOSCOPY      Current Outpatient Medications  Medication Sig Dispense Refill   ketorolac (ACULAR) 0.5 % ophthalmic solution Place 1 drop into the left eye 2 (two) times daily. 9 mL 4   No current facility-administered medications for this visit.    Family History  Problem Relation Age of Onset   Cancer Mother        bladder cancer   Cancer Father        colon cancer   Colon cancer Father 56   Esophageal cancer Neg Hx    Rectal cancer Neg Hx    Stomach cancer Neg Hx      Review of Systems  Constitutional: Negative.   HENT: Negative.    Eyes: Negative.   Respiratory: Negative.    Cardiovascular: Negative.   Gastrointestinal: Negative.   Endocrine: Negative.   Genitourinary: Negative.   Musculoskeletal: Negative.   Skin: Negative.   Allergic/Immunologic: Negative.   Neurological: Negative.   Hematological: Negative.   Psychiatric/Behavioral: Negative.     Exam:   BP 140/80    Pulse (!) 117    Resp 16    Ht 5' 5.5" (1.664 m)    Wt 123 lb (55.8 kg)    LMP 08/29/2008    BMI 20.16 kg/m     General appearance: alert, cooperative and appears stated age Head: normocephalic, without obvious abnormality, atraumatic Neck: no adenopathy, supple, symmetrical, trachea midline and thyroid normal to inspection and palpation Lungs: clear to auscultation bilaterally Breasts: normal appearance, no masses or tenderness, No nipple retraction or dimpling, No nipple discharge or bleeding, No axillary adenopathy Heart: regular rate and rhythm Abdomen: soft, non-tender; no masses, no organomegaly Extremities: extremities normal, atraumatic, no cyanosis or edema Skin: skin color, texture, turgor normal. No rashes or lesions Lymph nodes: cervical, supraclavicular, and axillary nodes normal. Neurologic: grossly  normal  Pelvic: External genitalia:  no lesions              No abnormal inguinal nodes palpated.              Urethra:  normal appearing urethra with no masses, tenderness or lesions              Bartholins and Skenes: normal                 Vagina: atrophy noted.               Cervix: no lesions              Pap taken: yes Bimanual Exam:  Uterus:  normal size, contour, position, consistency, mobility, non-tender              Adnexa: no mass, fullness, tenderness              Rectal exam: yes.  Confirms.              Anus:  normal sphincter tone, no lesions  Chaperone was present for exam:  Joy, CMA  Assessment:   Well woman visit with gynecologic  exam. Menopause 2010.  Estrogen deficiency. Vaginal atrophy.  FH colon cancer.  Constipation with fecal urgency.  Plan: Mammogram screening discussed. Self breast awareness reviewed. Pap and HR HPV collected. Guidelines for Calcium, Vitamin D, regular exercise program including cardiovascular and weight bearing exercise. We discussed cooking oils, vaginal vit E, or vaginal estrogen for atrophy treatment if desired. We discussed Metamucil and Miralax to treat constipation and fecal urgency. She has a colonoscopy scheduled.  Bone density scheduled. Fu in 2 years for routine exam and prn.  After visit summary provided.   15 min  total time was spent for this patient encounter, including preparation, face-to-face counseling with the patient, coordination of care, and documentation of the encounter.

## 2021-10-01 ENCOUNTER — Encounter: Payer: Self-pay | Admitting: Obstetrics and Gynecology

## 2021-10-01 ENCOUNTER — Other Ambulatory Visit: Payer: Self-pay

## 2021-10-01 ENCOUNTER — Other Ambulatory Visit (HOSPITAL_COMMUNITY)
Admission: RE | Admit: 2021-10-01 | Discharge: 2021-10-01 | Disposition: A | Discharge status: Home / Self Care | Payer: Medicare Other | Source: Ambulatory Visit | Attending: Obstetrics and Gynecology | Admitting: Obstetrics and Gynecology

## 2021-10-01 ENCOUNTER — Ambulatory Visit (INDEPENDENT_AMBULATORY_CARE_PROVIDER_SITE_OTHER): Payer: Medicare Other | Admitting: Obstetrics and Gynecology

## 2021-10-01 VITALS — BP 140/80 | HR 117 | Resp 16 | Ht 65.5 in | Wt 123.0 lb

## 2021-10-01 DIAGNOSIS — K5900 Constipation, unspecified: Secondary | ICD-10-CM | POA: Diagnosis not present

## 2021-10-01 DIAGNOSIS — E2839 Other primary ovarian failure: Secondary | ICD-10-CM

## 2021-10-01 DIAGNOSIS — Z01419 Encounter for gynecological examination (general) (routine) without abnormal findings: Secondary | ICD-10-CM | POA: Diagnosis not present

## 2021-10-01 DIAGNOSIS — Z124 Encounter for screening for malignant neoplasm of cervix: Secondary | ICD-10-CM | POA: Diagnosis not present

## 2021-10-01 DIAGNOSIS — Z1151 Encounter for screening for human papillomavirus (HPV): Secondary | ICD-10-CM | POA: Insufficient documentation

## 2021-10-01 NOTE — Patient Instructions (Signed)
EXERCISE AND DIET:  We recommended that you start or continue a regular exercise program for good health. Regular exercise means any activity that makes your heart beat faster and makes you sweat.  We recommend exercising at least 30 minutes per day at least 3 days a week, preferably 4 or 5.  We also recommend a diet low in fat and sugar.  Inactivity, poor dietary choices and obesity can cause diabetes, heart attack, stroke, and kidney damage, among others.   ° °ALCOHOL AND SMOKING:  Women should limit their alcohol intake to no more than 7 drinks/beers/glasses of wine (combined, not each!) per week. Moderation of alcohol intake to this level decreases your risk of breast cancer and liver damage. And of course, no recreational drugs are part of a healthy lifestyle.  And absolutely no smoking or even second hand smoke. Most people know smoking can cause heart and lung diseases, but did you know it also contributes to weakening of your bones? Aging of your skin?  Yellowing of your teeth and nails? ° °CALCIUM AND VITAMIN D:  Adequate intake of calcium and Vitamin D are recommended.  The recommendations for exact amounts of these supplements seem to change often, but generally speaking 600 mg of calcium (either carbonate or citrate) and 800 units of Vitamin D per day seems prudent. Certain women may benefit from higher intake of Vitamin D.  If you are among these women, your doctor will have told you during your visit.   ° °PAP SMEARS:  Pap smears, to check for cervical cancer or precancers,  have traditionally been done yearly, although recent scientific advances have shown that most women can have pap smears less often.  However, every woman still should have a physical exam from her gynecologist every year. It will include a breast check, inspection of the vulva and vagina to check for abnormal growths or skin changes, a visual exam of the cervix, and then an exam to evaluate the size and shape of the uterus and  ovaries.  And after 68 years of age, a rectal exam is indicated to check for rectal cancers. We will also provide age appropriate advice regarding health maintenance, like when you should have certain vaccines, screening for sexually transmitted diseases, bone density testing, colonoscopy, mammograms, etc.  ° °MAMMOGRAMS:  All women over 40 years old should have a yearly mammogram. Many facilities now offer a "3D" mammogram, which may cost around $50 extra out of pocket. If possible,  we recommend you accept the option to have the 3D mammogram performed.  It both reduces the number of women who will be called back for extra views which then turn out to be normal, and it is better than the routine mammogram at detecting truly abnormal areas.   ° °COLONOSCOPY:  Colonoscopy to screen for colon cancer is recommended for all women at age 50.  We know, you hate the idea of the prep.  We agree, BUT, having colon cancer and not knowing it is worse!!  Colon cancer so often starts as a polyp that can be seen and removed at colonscopy, which can quite literally save your life!  And if your first colonoscopy is normal and you have no family history of colon cancer, most women don't have to have it again for 10 years.  Once every ten years, you can do something that may end up saving your life, right?  We will be happy to help you get it scheduled when you are ready.    Be sure to check your insurance coverage so you understand how much it will cost.  It may be covered as a preventative service at no cost, but you should check your particular policy.    Constipation, Adult Constipation is when a person has fewer than three bowel movements in a week, has difficulty having a bowel movement, or has stools (feces) that are dry, hard, or larger than normal. Constipation may be caused by an underlying condition. It may become worse with age if a person takes certain medicines and does not take in enough fluids. Follow these  instructions at home: Eating and drinking  Eat foods that have a lot of fiber, such as beans, whole grains, and fresh fruits and vegetables. Limit foods that are low in fiber and high in fat and processed sugars, such as fried or sweet foods. These include french fries, hamburgers, cookies, candies, and soda. Drink enough fluid to keep your urine pale yellow. General instructions Exercise regularly or as told by your health care provider. Try to do 150 minutes of moderate exercise each week. Use the bathroom when you have the urge to go. Do not hold it in. Take over-the-counter and prescription medicines only as told by your health care provider. This includes any fiber supplements. During bowel movements: Practice deep breathing while relaxing the lower abdomen. Practice pelvic floor relaxation. Watch your condition for any changes. Let your health care provider know about them. Keep all follow-up visits as told by your health care provider. This is important. Contact a health care provider if: You have pain that gets worse. You have a fever. You do not have a bowel movement after 4 days. You vomit. You are not hungry or you lose weight. You are bleeding from the opening between the buttocks (anus). You have thin, pencil-like stools. Get help right away if: You have a fever and your symptoms suddenly get worse. You leak stool or have blood in your stool. Your abdomen is bloated. You have severe pain in your abdomen. You feel dizzy or you faint. Summary Constipation is when a person has fewer than three bowel movements in a week, has difficulty having a bowel movement, or has stools (feces) that are dry, hard, or larger than normal. Eat foods that have a lot of fiber, such as beans, whole grains, and fresh fruits and vegetables. Drink enough fluid to keep your urine pale yellow. Take over-the-counter and prescription medicines only as told by your health care provider. This includes  any fiber supplements. This information is not intended to replace advice given to you by your health care provider. Make sure you discuss any questions you have with your health care provider. Document Revised: 07/03/2019 Document Reviewed: 07/03/2019 Elsevier Patient Education  [employer].  Consider Miralax or Metamucil powder to treat constipation.

## 2021-10-04 LAB — CYTOLOGY - PAP
Comment: NEGATIVE
Diagnosis: NEGATIVE
High risk HPV: NEGATIVE

## 2021-10-06 ENCOUNTER — Other Ambulatory Visit: Payer: Self-pay

## 2021-10-06 ENCOUNTER — Ambulatory Visit (AMBULATORY_SURGERY_CENTER): Payer: Medicare Other | Admitting: *Deleted

## 2021-10-06 VITALS — Ht 65.5 in | Wt 123.0 lb

## 2021-10-06 DIAGNOSIS — Z800 Family history of malignant neoplasm of digestive organs: Secondary | ICD-10-CM

## 2021-10-06 NOTE — Progress Notes (Signed)
Patient's pre-visit was done today over the phone with the patient. Name,DOB and address verified. Patient denies any allergies to Eggs and Soy. Patient denies any problems with anesthesia/sedation. Patient is not taking any diet pills or blood thinners. No home Oxygen.   Prep instructions sent to pt's MyChart-pt aware. Patient understands to call us back with any questions or concerns. Patient is aware of our care-partner policy and SUGAY-84 safety protocol. Patient states she is having constipation-does have BM every day-I encouraged patient to talk with Dr.Gessner day of procedure about this. Patient also will take dulcolax if needed for constipation before prep.   EMMI education assigned to the patient for the procedure, sent to [facility].   The patient is COVID-19 vaccinated.

## 2021-10-20 ENCOUNTER — Other Ambulatory Visit: Payer: Self-pay | Admitting: Internal Medicine

## 2021-10-20 ENCOUNTER — Encounter: Payer: Self-pay | Admitting: Internal Medicine

## 2021-10-20 ENCOUNTER — Ambulatory Visit (AMBULATORY_SURGERY_CENTER): Payer: Medicare Other | Admitting: Internal Medicine

## 2021-10-20 VITALS — BP 154/69 | HR 76 | Temp 98.0°F | Resp 13 | Ht 65.5 in | Wt 123.0 lb

## 2021-10-20 DIAGNOSIS — Z1211 Encounter for screening for malignant neoplasm of colon: Secondary | ICD-10-CM | POA: Diagnosis not present

## 2021-10-20 DIAGNOSIS — K635 Polyp of colon: Secondary | ICD-10-CM | POA: Diagnosis not present

## 2021-10-20 DIAGNOSIS — D120 Benign neoplasm of cecum: Secondary | ICD-10-CM

## 2021-10-20 DIAGNOSIS — Z800 Family history of malignant neoplasm of digestive organs: Secondary | ICD-10-CM

## 2021-10-20 MED ORDER — SODIUM CHLORIDE 0.9 % IV SOLN: 500.0000 mL | Freq: Once | INTRAVENOUS | Status: DC

## 2021-10-20 NOTE — Op Note (Addendum)
[place] Patient Name: Melissa Russell Procedure Date: 10/20/2021 2:00 PM MRN: [id number] Endoscopist: Gatha Mayer , MD Age: 66 Referring MD:  Date of Birth: 1955/10/08 Gender: Female Account #: [id number] Procedure:                Colonoscopy Indications:              Colon cancer screening in patient at increased                            risk: Colorectal cancer in father Medicines:                Propofol per Anesthesia, Monitored Anesthesia Care Procedure:                Pre-Anesthesia Assessment:                           - Prior to the procedure, a History and Physical                            was performed, and patient medications and                            allergies were reviewed. The patient's tolerance of                            previous anesthesia was also reviewed. The risks                            and benefits of the procedure and the sedation                            options and risks were discussed with the patient.                            All questions were answered, and informed consent                            was obtained. Prior Anticoagulants: The patient has                            taken no previous anticoagulant or antiplatelet                            agents. ASA Grade Assessment: II - A patient with                            mild systemic disease. After reviewing the risks                            and benefits, the patient was deemed in                            satisfactory condition to undergo the procedure.  After obtaining informed consent, the colonoscope                            was passed under direct vision. Throughout the                            procedure, the patient's blood pressure, pulse, and                            oxygen saturations were monitored continuously. The                            PCF-HQ190L Colonoscope was introduced through the                            anus  and advanced to the the cecum, identified by                            appendiceal orifice and ileocecal valve. The                            colonoscopy was performed without difficulty. The                            patient tolerated the procedure well. The quality                            of the bowel preparation was good. The ileocecal                            valve, appendiceal orifice, and rectum were                            photographed. The bowel preparation used was                            Miralax via split dose instruction. Scope In: 2:15:26 PM Scope Out: 2:35:11 PM Scope Withdrawal Time: 0 hours 11 minutes 23 seconds  Total Procedure Duration: 0 hours 19 minutes 45 seconds  Findings:                 The perianal and digital rectal examinations were                            normal.                           A 1 to 2 mm polyp was found in the ascending colon.                            The polyp was sessile. The polyp was removed with a                            cold biopsy forceps. Resection and retrieval were  complete. Verification of patient identification                            for the specimen was done. Estimated blood loss was                            minimal.                           Scattered diverticula were found in the sigmoid                            colon, descending colon and transverse colon.                           Internal hemorrhoids were found.                           The exam was otherwise without abnormality on                            direct and retroflexion views. Complications:            No immediate complications. Estimated Blood Loss:     Estimated blood loss was minimal. Impression:               - One 1 to 2 mm polyp in the ascending colon,                            removed with a cold biopsy forceps. Resected and                            retrieved.                           -  Diverticulosis in the sigmoid colon, in the                            descending colon and in the transverse colon.                           - Internal hemorrhoids.                           - The examination was otherwise normal on direct                            and retroflexion views. Recommendation:           - Patient has a contact number available for                            emergencies. The signs and symptoms of potential                            delayed complications were discussed with the  patient. Return to normal activities tomorrow.                            Written discharge instructions were provided to the                            patient.                           - Await pathology results.                           - Try increasing fiber in dietr vs supplement to                            help incomplete defecation symptoms.                           - father was in 87's when diagnosed so we can                            follow polyp surveillance guidelines and not FHx Gatha Mayer, MD 10/20/2021 2:44:38 PM This report has been signed electronically.

## 2021-10-20 NOTE — Progress Notes (Signed)
Vitals-DT  Pt's states no medical or surgical changes since previsit or office visit.  

## 2021-10-20 NOTE — Progress Notes (Signed)
[facility] History and Physical   Primary Care Physician:  Luetta Nutting, DO   Reason for Procedure:   Family hx cRCA  Plan:    colonoscopy     HPI: Melissa Russell is a 66 y.o. female here for colonoscopy as above   Past Medical History:  Diagnosis Date   Dental disease    having implants d/t tooth growing up into sinuses   Heart valve disorder    "leaking"   Hypertension     Past Surgical History:  Procedure Laterality Date   cararact surgery Left    COLONOSCOPY  07/12/2016   Dr.Danetra Glock    Prior to Admission medications   Medication Sig Start Date End Date Taking? Authorizing Provider  ketorolac (ACULAR) 0.5 % ophthalmic solution Place 1 drop into the left eye 2 (two) times daily. 06/21/21 08/07/22 Yes Rankin, Clent Demark, MD    Current Outpatient Medications  Medication Sig Dispense Refill   ketorolac (ACULAR) 0.5 % ophthalmic solution Place 1 drop into the left eye 2 (two) times daily. 9 mL 4   Current Facility-Administered Medications  Medication Dose Route Frequency Provider Last Rate Last Admin   0.9 %  sodium chloride infusion  500 mL Intravenous Once Gatha Mayer, MD        Allergies as of 10/20/2021 - Review Complete 10/20/2021  Allergen Reaction Noted   Augmentin [amoxicillin-pot clavulanate]  04/18/2012   Clindamycin/lincomycin  04/18/2016    Family History  Problem Relation Age of Onset   Cancer Mother        bladder cancer   Cancer Father        colon cancer   Colon cancer Father 6   Esophageal cancer Neg Hx    Rectal cancer Neg Hx    Stomach cancer Neg Hx     Social History   Socioeconomic History   Marital status: Married    Spouse name: Eddie Dibbles   Number of children: 0   Years of education: 18   Highest education level: Master's degree (e.g., MA, MS, MEng, MEd, MSW, MBA)  Occupational History   Occupation: Retired.  Tobacco Use   Smoking status: Never   Smokeless tobacco: Never  Vaping Use   Vaping Use: Never  used  Substance and Sexual Activity   Alcohol use: No    Alcohol/week: 0.0 standard drinks   Drug use: No   Sexual activity: Not Currently    Partners: Male    Birth control/protection: Post-menopausal    Comment: older than 82, less than 5  Other Topics Concern   Not on file  Social History Narrative   Lives with her husband. She enjoys doing a lot of genealogy research, dancing, reading and fitness class.    Social Determinants of Health   Financial Resource Strain: Low Risk    Difficulty of Paying Living Expenses: Not hard at all  Food Insecurity: No Food Insecurity   Worried About Charity fundraiser in the Last Year: Never true   [place] in the Last Year: Never true  Transportation Needs: No Transportation Needs   Lack of Transportation (Medical): No   Lack of Transportation (Non-Medical): No  Physical Activity: Sufficiently Active   Days of Exercise per Week: 7 days   Minutes of Exercise per Session: 50 min  Stress: No Stress Concern Present   Feeling of Stress : Not at all  Social Connections: Socially Integrated   Frequency of Communication with Friends and Family: More than  three times a week   Frequency of Social Gatherings with Friends and Family: More than three times a week   Attends Religious Services: More than 4 times per year   Active Member of [employer] or Organizations: Yes   Attends Music therapist: More than 4 times per year   Marital Status: Married  Human resources officer Violence: Not At Risk   Fear of Current or Ex-Partner: No   Emotionally Abused: No   Physically Abused: No   Sexually Abused: No    Review of Systems:  All other review of systems negative except as mentioned in the HPI.  Physical Exam: Vital signs BP (!) 166/95 (BP Location: Right Arm, Patient Position: Sitting, Cuff Size: Normal)    Pulse (!) 115    Temp 98 F (36.7 C) (Temporal)    Ht 5' 5.5" (1.664 m)    Wt 123 lb (55.8 kg)    LMP 08/29/2008    SpO2 100%    BMI  20.16 kg/m   General:   Alert,  Well-developed, well-nourished, pleasant and cooperative in NAD Lungs:  Clear throughout to auscultation.   Heart:  Regular rate and rhythm; no murmurs, clicks, rubs,  or gallops. Abdomen:  Soft, nontender and nondistended. Normal bowel sounds.   Neuro/Psych:  Alert and cooperative. Normal mood and affect. A and O x 3   @Satori Krabill  Simonne Maffucci, MD, [facility] [phone] (pager) 10/20/2021 2:04 PM@

## 2021-10-20 NOTE — Progress Notes (Signed)
Called to room to assist during endoscopic procedure.  Patient ID and intended procedure confirmed with present staff. Received instructions for my participation in the procedure from the performing physician.  

## 2021-10-20 NOTE — Progress Notes (Signed)
Pt non-responsive, VVS, Report to RN  °

## 2021-10-20 NOTE — Patient Instructions (Addendum)
There was a very tiny polyp seen and removed. I will get it analyzed to see if pre-cancerous.   Also saw diverticulosis and hemorrhoids.  Try increasing fiber by eating prunes or kiwi fruit. 4 prunes or 2 whole kiwi fruit can help. You coul also try psyllium 1 - 2 tablespoons a day but start with teaspoons and increase dose.   I appreciate the opportunity to care for you. Gatha Mayer, MD, [facility]  Resume previous medications.  1 polyp removed and sent to pathology.  Await results for final recommendations.  Handouts on findings given to patient ( polyps, diverticulosis and hemorrhoids)  YOU HAD AN ENDOSCOPIC PROCEDURE TODAY AT [facility]:   Refer to the procedure report that was given to you for any specific questions about what was found during the examination.  If the procedure report does not answer your questions, please call your gastroenterologist to clarify.  If you requested that your care partner not be given the details of your procedure findings, then the procedure report has been included in a sealed envelope for you to review at your convenience later.  YOU SHOULD EXPECT: Some feelings of bloating in the abdomen. Passage of more gas than usual.  Walking can help get rid of the air that was put into your GI tract during the procedure and reduce the bloating. If you had a lower endoscopy (such as a colonoscopy or flexible sigmoidoscopy) you may notice spotting of blood in your stool or on the toilet paper. If you underwent a bowel prep for your procedure, you may not have a normal bowel movement for a few days.  Please Note:  You might notice some irritation and congestion in your nose or some drainage.  This is from the oxygen used during your procedure.  There is no need for concern and it should clear up in a day or so.  SYMPTOMS TO REPORT IMMEDIATELY:  Following lower endoscopy (colonoscopy or flexible sigmoidoscopy):  Excessive amounts of blood in the  stool  Significant tenderness or worsening of abdominal pains  Swelling of the abdomen that is new, acute  Fever of 100F or higher   For urgent or emergent issues, a gastroenterologist can be reached at any hour by calling [phone]. Do not use MyChart messaging for urgent concerns.    DIET:  We do recommend a small meal at first, but then you may proceed to your regular diet.  Drink plenty of fluids but you should avoid alcoholic beverages for 24 hours.  ACTIVITY:  You should plan to take it easy for the rest of today and you should NOT DRIVE or use heavy machinery until tomorrow (because of the sedation medicines used during the test).    FOLLOW UP: Our staff will call the number listed on your records 48-72 hours following your procedure to check on you and address any questions or concerns that you may have regarding the information given to you following your procedure. If we do not reach you, we will leave a message.  We will attempt to reach you two times.  During this call, we will ask if you have developed any symptoms of COVID 19. If you develop any symptoms (ie: fever, flu-like symptoms, shortness of breath, cough etc.) before then, please call [phone].  If you test positive for Covid 19 in the 2 weeks post procedure, please call and report this information to [place].    If any biopsies were taken you will be  contacted by phone or by letter within the next 1-3 weeks.  Please call us at [phone] if you have not heard about the biopsies in 3 weeks.    SIGNATURES/CONFIDENTIALITY: You and/or your care partner have signed paperwork which will be entered into your electronic medical record.  These signatures attest to the fact that that the information above on your After Visit Summary has been reviewed and is understood.  Full responsibility of the confidentiality of this discharge information lies with you and/or your care-partner.

## 2021-10-22 ENCOUNTER — Telehealth: Payer: Self-pay

## 2021-10-22 NOTE — Telephone Encounter (Signed)
°  Follow up Call-  Call back number 10/20/2021  Post procedure Call Back phone  # [phone]  Permission to leave phone message Yes  Some recent data might be hidden     Patient questions:  Do you have a fever, pain , or abdominal swelling? No. Pain Score  0 *  Have you tolerated food without any problems? Yes.    Have you been able to return to your normal activities? Yes.    Do you have any questions about your discharge instructions: Diet   No. Medications  No. Follow up visit  No.  Do you have questions or concerns about your Care? Yes.    Actions: * If pain score is 4 or above: No action needed, pain <4. Patient was able to have breakfast and lunch yesterday but states that 15 minutes after dinner she has a bout of diarrhea and some bloating.  Explained that she could take Gas X or Simethicone for the bloating and that it sometimes takes a few days to get back to regular bowel habits.  No tenderness or pain or fever noted.

## 2021-10-25 ENCOUNTER — Telehealth: Payer: Self-pay | Admitting: Internal Medicine

## 2021-10-25 NOTE — Telephone Encounter (Signed)
Pt was notified that she just had her Colonoscopy on the 22nd and that she did a 2 day  prep for that in which could take a little time for her Bowels to get back to normal. Pt was encouraged to give it several more days and if she is still feeling bloated then to contact our office : Pt verbalized understanding with all questions answered.

## 2021-10-25 NOTE — Telephone Encounter (Signed)
Patient had colonoscopy on 2/22.  She is still having issues with gas and bloating and wants to know if that is normal.  Please call patient and advise.  Thank you.

## 2021-10-28 ENCOUNTER — Other Ambulatory Visit: Payer: Self-pay | Admitting: Obstetrics and Gynecology

## 2021-10-28 DIAGNOSIS — E2839 Other primary ovarian failure: Secondary | ICD-10-CM

## 2021-10-31 ENCOUNTER — Encounter: Payer: Self-pay | Admitting: Internal Medicine

## 2021-11-24 NOTE — Telephone Encounter (Signed)
Pt was sent a my chart message and voice mail was left  on pt phone to call us back so we can schedule her an appointment: ? ?

## 2021-11-24 NOTE — Telephone Encounter (Signed)
Pt was scheduled  for an appointment on 11/25/2021 at 2:50 to see Dr. Carlean Purl: Pt aware ?Pt verbalized understanding with all questions answered.  ? ?

## 2021-11-25 ENCOUNTER — Other Ambulatory Visit (INDEPENDENT_AMBULATORY_CARE_PROVIDER_SITE_OTHER): Payer: Medicare Other

## 2021-11-25 ENCOUNTER — Encounter: Payer: Self-pay | Admitting: Internal Medicine

## 2021-11-25 ENCOUNTER — Ambulatory Visit (INDEPENDENT_AMBULATORY_CARE_PROVIDER_SITE_OTHER): Payer: Medicare Other | Admitting: Internal Medicine

## 2021-11-25 VITALS — BP 130/82 | HR 114 | Ht 65.0 in | Wt 125.4 lb

## 2021-11-25 DIAGNOSIS — R140 Abdominal distension (gaseous): Secondary | ICD-10-CM

## 2021-11-25 LAB — TSH: TSH: 4.37 u[IU]/mL (ref 0.35–5.50)

## 2021-11-25 NOTE — Progress Notes (Signed)
? ?Melissa Russell 66 y.o. [birth year] [id number] ? ?Assessment & Plan:  ? ?Encounter Diagnosis  ?Name Primary?  ? Bloating Yes  ? ? ?The subcutaneous fat in her lower abdomen is what I think she is bothered by.  I cannot create a connection between colonoscopy and that occurring.  I am not sure why her perception of this is different, she weighs a couple of pounds more since the colonoscopy but she indicates her scales at home shows she is stable.  We will check a TSH to see if there is any thyroid change that might be related to this but beyond that I am not sure what else to tell her.  I do not think I have anything else to offer. ? ?CC: Luetta Nutting, DO ? ? ?Subjective:  ? ?Chief Complaint: Bloating ? ?HPI ?66 year old white woman with a family history of colon cancer in her elderly father status post most recent colonoscopy 10/20/2021, and a diminutive cecal polyp was removed she had diverticulosis and hemorrhoids.  The polyp was not precancerous.  Recall recommended for 10 years. ? ?Subsequent to that she reported to me that she had felt bloated, we had some phone conversations and [place] conversations.  She has some constipation I recommended that she give it some time and then she had a MyChart message on 10/31/2021, and she indicated she was eating [place] yogurt and taking a daily probiotic but she still felt very bloated so I recommended this visit. ? ?She is mildly constipated still moves her bowels most days it sounds like but her main complaint is that she feels like she looks pregnant.  She says that when she looks sideways in a mirror there is a bulge at the flanks and her lower abdomen that was not there prior to the colonoscopy.  She feels like her waist is not the same shape. ? ?Wt Readings from Last 3 Encounters:  ?11/25/21 125 lb 6.4 oz (56.9 kg)  ?10/20/21 123 lb (55.8 kg)  ?10/06/21 123 lb (55.8 kg)  ? ? ? ?Allergies  ?Allergen Reactions  ? Augmentin [Amoxicillin-Pot Clavulanate]   ?  Causes  thrush  ? Clindamycin/Lincomycin   ? ?Current Meds  ?Medication Sig  ? ketorolac (ACULAR) 0.5 % ophthalmic solution Place 1 drop into the left eye 2 (two) times daily.  ? ?Past Medical History:  ?Diagnosis Date  ? Dental disease   ? having implants d/t tooth growing up into sinuses  ? Heart valve disorder   ? "leaking"  ? Hypertension   ? ?Past Surgical History:  ?Procedure Laterality Date  ? cararact surgery Left   ? COLONOSCOPY  07/12/2016  ? Dr.Orva Riles  ? ?Social History  ? ?Social History Narrative  ? Lives with her husband. She enjoys doing a lot of genealogy research, dancing, reading and fitness class.   ? ?family history includes Cancer in her father and mother; Colon cancer (age of onset: 31) in her father. ? ? ?Review of Systems ?As per HPI ? ?Objective:  ? Physical Exam ?BP 130/82   Pulse (!) 114   Ht '5\' 5"'$  (1.651 m)   Wt 125 lb 6.4 oz (56.9 kg)   LMP 08/29/2008   SpO2 98%   BMI 20.87 kg/m?  ? ?Thin white woman in no acute distress ?Abdominal exam is soft and nontender and benign.  It is not distended.  She has good abdominal wall tone.  There is a small amount of subcutaneous fat in the lower abdomen.  This  is the area she says was not present prior to the colonoscopy. ?

## 2021-11-25 NOTE — Patient Instructions (Signed)

## 2021-11-26 ENCOUNTER — Encounter: Payer: Self-pay | Admitting: Internal Medicine

## 2021-12-20 ENCOUNTER — Encounter (INDEPENDENT_AMBULATORY_CARE_PROVIDER_SITE_OTHER): Payer: Self-pay | Admitting: Ophthalmology

## 2021-12-20 ENCOUNTER — Ambulatory Visit (INDEPENDENT_AMBULATORY_CARE_PROVIDER_SITE_OTHER): Payer: Medicare Other | Admitting: Ophthalmology

## 2021-12-20 DIAGNOSIS — H43813 Vitreous degeneration, bilateral: Secondary | ICD-10-CM

## 2021-12-20 DIAGNOSIS — H35352 Cystoid macular degeneration, left eye: Secondary | ICD-10-CM | POA: Diagnosis not present

## 2021-12-20 DIAGNOSIS — H35351 Cystoid macular degeneration, right eye: Secondary | ICD-10-CM | POA: Diagnosis not present

## 2021-12-20 DIAGNOSIS — H2511 Age-related nuclear cataract, right eye: Secondary | ICD-10-CM

## 2021-12-20 NOTE — Progress Notes (Signed)
? ? ?12/20/2021 ? ?  ? ?CHIEF COMPLAINT ?Patient presents for  ?Chief Complaint  ?Patient presents with  ? Retina Evaluation  ? ? ? ? ?HISTORY OF PRESENT ILLNESS: ?Melissa Russell is a 66 y.o. female who presents to the clinic today for:  ? ?HPI   ?History of chronic recurrent CME OS pseudophakic related, stabilized and improved long-term on chronic NSAID use, ketorolac 1 drop left eye twice daily, patient reports excellent compliance ?Last edited by Hurman Horn, MD on 12/20/2021 10:36 AM.  ?  ? ? ?Referring physician: ?Luetta Nutting, DO ?[address]  ?[address] ?[place],   [id number] ? ?HISTORICAL INFORMATION:  ? ?Selected notes from the [facility] ?  ?   ? ?CURRENT MEDICATIONS: ?Current Outpatient Medications (Ophthalmic Drugs)  ?Medication Sig  ? ketorolac (ACULAR) 0.5 % ophthalmic solution Place 1 drop into the left eye 2 (two) times daily.  ? ?No current facility-administered medications for this visit. (Ophthalmic Drugs)  ? ?No current outpatient medications on file. (Other)  ? ?No current facility-administered medications for this visit. (Other)  ? ? ? ? ?REVIEW OF SYSTEMS: ?ROS   ?Negative for: Constitutional, Gastrointestinal, Neurological, Skin, Genitourinary, Musculoskeletal, HENT, Endocrine, Cardiovascular, Eyes, Respiratory, Psychiatric, Allergic/Imm, Heme/Lymph ?Last edited by Hurman Horn, MD on 12/20/2021 10:31 AM.  ?  ? ? ? ?ALLERGIES ?Allergies  ?Allergen Reactions  ? Augmentin [Amoxicillin-Pot Clavulanate]   ?  Causes thrush  ? Clindamycin/Lincomycin   ? ? ?PAST MEDICAL HISTORY ?Past Medical History:  ?Diagnosis Date  ? Dental disease   ? having implants d/t tooth growing up into sinuses  ? Heart valve disorder   ? "leaking"  ? Hypertension   ? Vitreomacular adhesion of both eyes 02/11/2021  ? Likely physiologic at this time  ? ?Past Surgical History:  ?Procedure Laterality Date  ? cararact surgery Left   ? COLONOSCOPY  07/12/2016  ? Dr.Gessner  ? ? ?FAMILY HISTORY ?Family  History  ?Problem Relation Age of Onset  ? Cancer Mother   ?     bladder cancer  ? Cancer Father   ?     colon cancer  ? Colon cancer Father 87  ? Esophageal cancer Neg Hx   ? Rectal cancer Neg Hx   ? Stomach cancer Neg Hx   ? ? ?SOCIAL HISTORY ?Social History  ? ?Tobacco Use  ? Smoking status: Never  ? Smokeless tobacco: Never  ?Vaping Use  ? Vaping Use: Never used  ?Substance Use Topics  ? Alcohol use: No  ?  Alcohol/week: 0.0 standard drinks  ? Drug use: No  ? ?  ? ?  ? ?OPHTHALMIC EXAM: ? ?Base Eye Exam   ? ? Visual Acuity (ETDRS)   ? ?   Right Left  ? Dist [place] 20/25 20/50  ? ?  ?  ? ? Tonometry (Tonopen, 10:35 AM)   ? ?   Right Left  ? Pressure 13 12  ? ?  ?  ? ? Pupils   ?Tonic pupils bilaterally, no response ? ?Dilated naturally OU ? ?  ?  ? ? Visual Fields   ? ?   Left Right  ?  Full Full  ? ?  ?  ? ? Extraocular Movement   ? ?   Right Left  ?  Full, Ortho Full, Ortho  ? ?  ?  ? ? Neuro/Psych   ? ? Oriented x3: Yes  ? Mood/Affect: Normal  ? ?  ?  ? ?  Dilation   ? ? @ 10:34 AM  ? ?  ?  ? ?  ? ?Slit Lamp and Fundus Exam   ? ? External Exam   ? ?   Right Left  ? External Normal Normal  ? ?  ?  ? ? Slit Lamp Exam   ? ?   Right Left  ? Lids/Lashes Normal Normal  ? Conjunctiva/Sclera White and quiet White and quiet  ? Cornea Clear Clear  ? Anterior Chamber Deep and quiet Deep and quiet  ? Iris Round and reactive, brown, diffuse  transillumination defects 360 Round and reactive, brown, diffuse  transillumination defects 360  ? Lens 3+ Nuclear sclerosis Centered posterior chamber intraocular lens  ? Anterior Vitreous Normal Normal  ? ?  ?  ? ? Fundus Exam   ? ?   Right Left  ? Posterior Vitreous Normal Normal  ? Disc Normal Normal  ? C/D Ratio 0.45 0.25  ? Macula Normal Normal, CME resolved  ? Vessels Normal Normal  ? Periphery Normal Normal  ? ?  ?  ? ?  ? ? ?IMAGING AND PROCEDURES  ?Imaging and Procedures for 12/20/21 ? ?OCT, Retina - OU - Both Eyes   ? ?   ?Right Eye ?Quality was good. Scan locations included  subfoveal. Central Foveal Thickness: 317. Progression has been stable. Findings include normal foveal contour.  ? ?Left Eye ?Quality was good. Scan locations included subfoveal. Progression has been stable. Findings include normal foveal contour.  ? ?Notes ?CME OS resolved and yet also maintained resolution at use of topical NSAID, ketorolac twice daily will continue to use this chronically OS ? ? ?  ? ? ?  ?  ? ?  ?ASSESSMENT/PLAN: ? ?Posterior vitreous detachment of both eyes ?Physiologic OU ? ?Nuclear sclerotic cataract of right eye ?OD accounts for acuity ? ?Cystoid macular edema of right eye ?No recurrence ? ?Cystoid macular edema of left eye ?No recurrence, continues resolution on twice daily topical NSAID, ketorolac twice daily  ? ?  ICD-10-CM   ?1. Cystoid macular edema of left eye  H35.352 OCT, Retina - OU - Both Eyes  ?  ?2. Posterior vitreous detachment of both eyes  H43.813   ?  ?3. Nuclear sclerotic cataract of right eye  H25.11   ?  ?4. Cystoid macular edema of right eye  H35.351   ?  ? ? ?1.  OD with cataract accounts for acuity follow-up with Dr. Midge Aver as scheduled and consider cataract surgery at any time symptoms affect the patient's lifestyle ? ?2.  OS, with chronic recurrent CME controlled NSAID 4 times daily and they will stabilize and prevented from recurrence At topical ketorolac twice daily.  We will continue ? ?3. ? ?Ophthalmic Meds Ordered this visit:  ?No orders of the defined types were placed in this encounter. ? ? ?  ? ?Return in about 6 months (around 06/21/2022) for COLOR FP, OCT, NO DILATE. ? ?There are no Patient Instructions on file for this visit. ? ? ?Explained the diagnoses, plan, and follow up with the patient and they expressed understanding.  Patient expressed understanding of the importance of proper follow up care.  ? ?Clent Demark. Gaytha Raybourn M.D. ?Diseases & Surgery of the Retina and Vitreous ?[place] ?12/20/21 ? ? ? ? ?Abbreviations: ?M myopia  (nearsighted); A astigmatism; H hyperopia (farsighted); P presbyopia; Mrx spectacle prescription;  CTL contact lenses; OD right eye; OS left eye; OU both eyes  XT  exotropia; ET esotropia; PEK punctate epithelial keratitis; PEE punctate epithelial erosions; DES dry eye syndrome; MGD meibomian gland dysfunction; ATs artificial tears; PFAT's preservative free artificial tears; [place] nuclear sclerotic cataract; PSC posterior subcapsular cataract; ERM epi-retinal membrane; PVD posterior vitreous detachment; RD retinal detachment; DM diabetes mellitus; DR diabetic retinopathy; NPDR non-proliferative diabetic retinopathy; PDR proliferative diabetic retinopathy; CSME clinically significant macular edema; DME diabetic macular edema; dbh dot blot hemorrhages; CWS cotton wool spot; POAG primary open angle glaucoma; C/D cup-to-disc ratio; HVF humphrey visual field; GVF goldmann visual field; OCT optical coherence tomography; IOP intraocular pressure; BRVO Branch retinal vein occlusion; CRVO central retinal vein occlusion; CRAO central retinal artery occlusion; BRAO branch retinal artery occlusion; RT retinal tear; SB scleral buckle; PPV pars plana vitrectomy; VH Vitreous hemorrhage; PRP panretinal laser photocoagulation; IVK intravitreal kenalog; VMT vitreomacular traction; MH Macular hole;  NVD neovascularization of the disc; NVE neovascularization elsewhere; AREDS age related eye disease study; ARMD age related macular degeneration; POAG primary open angle glaucoma; EBMD epithelial/anterior basement membrane dystrophy; ACIOL anterior chamber intraocular lens; IOL intraocular lens; PCIOL posterior chamber intraocular lens; Phaco/IOL phacoemulsification with intraocular lens placement; [place] photorefractive keratectomy; LASIK laser assisted in situ keratomileusis; HTN hypertension; DM diabetes mellitus; COPD chronic obstructive pulmonary disease ?

## 2021-12-20 NOTE — Assessment & Plan Note (Signed)
OD accounts for acuity ?

## 2021-12-20 NOTE — Assessment & Plan Note (Signed)
Physiologic OU 

## 2021-12-20 NOTE — Assessment & Plan Note (Signed)
No recurrence, continues resolution on twice daily topical NSAID, ketorolac twice daily ?

## 2021-12-20 NOTE — Assessment & Plan Note (Signed)
No recurrence. 

## 2022-03-30 ENCOUNTER — Ambulatory Visit
Admission: RE | Admit: 2022-03-30 | Discharge: 2022-03-30 | Disposition: A | Discharge status: Home / Self Care | Payer: Medicare Other | Source: Ambulatory Visit | Attending: Obstetrics and Gynecology | Admitting: Obstetrics and Gynecology

## 2022-03-30 DIAGNOSIS — E2839 Other primary ovarian failure: Secondary | ICD-10-CM

## 2022-04-01 ENCOUNTER — Encounter: Payer: Self-pay | Admitting: Obstetrics and Gynecology

## 2022-04-04 ENCOUNTER — Ambulatory Visit (INDEPENDENT_AMBULATORY_CARE_PROVIDER_SITE_OTHER): Payer: Medicare Other | Admitting: Obstetrics and Gynecology

## 2022-04-04 ENCOUNTER — Encounter: Payer: Self-pay | Admitting: Obstetrics and Gynecology

## 2022-04-04 VITALS — BP 164/84 | Ht 65.5 in | Wt 123.0 lb

## 2022-04-04 DIAGNOSIS — R140 Abdominal distension (gaseous): Secondary | ICD-10-CM | POA: Diagnosis not present

## 2022-04-04 DIAGNOSIS — M810 Age-related osteoporosis without current pathological fracture: Secondary | ICD-10-CM

## 2022-04-04 NOTE — Patient Instructions (Addendum)
Zoledronic Acid Injection (Bone Disorders) What is this medication? ZOLEDRONIC ACID (ZOE le dron ik AS id) prevents and treats osteoporosis. It may also be used to treat Paget's disease of the bone. It works by making your bones stronger and less likely to break (fracture). It belongs to a group of medications called bisphosphonates. This medicine may be used for other purposes; ask your health care provider or pharmacist if you have questions. COMMON BRAND NAME(S): Reclast What should I tell my care team before I take this medication? They need to know if you have any of these conditions: Bleeding disorder Cancer Dental disease Kidney disease Low levels of calcium in the blood Low red blood cell counts Lung or breathing disease, such as asthma Receiving steroids, such as dexamethasone or prednisone An unusual or allergic reaction to zoledronic acid, other medications, foods, dyes, or preservatives Pregnant or trying to get pregnant Breast-feeding How should I use this medication? This medication is injected into a vein. It is given by your care team in a hospital or clinic setting. A special MedGuide will be given to you before each treatment. Be sure to read this information carefully each time. Talk to your care team about the use of this medication in children. Special care may be needed. Overdosage: If you think you have taken too much of this medicine contact a poison control center or emergency room at once. NOTE: This medicine is only for you. Do not share this medicine with others. What if I miss a dose? Keep appointments for follow-up doses. It is important not to miss your dose. Call your care team if you are unable to keep an appointment. What may interact with this medication? Certain antibiotics given by injection Medications for pain and inflammation, such as ibuprofen, naproxen, NSAIDs Some diuretics, such as bumetanide, furosemide Teriparatide This list may not  describe all possible interactions. Give your health care provider a list of all the medicines, herbs, non-prescription drugs, or dietary supplements you use. Also tell them if you smoke, drink alcohol, or use illegal drugs. Some items may interact with your medicine. What should I watch for while using this medication? Visit your care team for regular checks on your progress. It may be some time before you see the benefit from this medication. Some people who take this medication have severe bone, joint, or muscle pain. This medication may also increase your risk for jaw problems or a broken thigh bone. Tell your care team right away if you have severe pain in your jaw, bones, joints, or muscles. Tell your care team if you have any pain that does not go away or that gets worse. You should make sure you get enough calcium and vitamin D while you are taking this medication. Discuss the foods you eat and the vitamins you take with your care team. You may need bloodwork while taking this medication. Tell your dentist and dental surgeon that you are taking this medication. You should not have major dental surgery while on this medication. See your dentist to have a dental exam and fix any dental problems before starting this medication. Take good care of your teeth while on this medication. Make sure you see your dentist for regular follow-up appointments. What side effects may I notice from receiving this medication? Side effects that you should report to your care team as soon as possible: Allergic reactions--skin rash, itching, hives, swelling of the face, lips, tongue, or throat Kidney injury--decrease in the amount of urine,   swelling of the ankles, hands, or feet Low calcium level--muscle pain or cramps, confusion, tingling, or numbness in the hands or feet Osteonecrosis of the jaw--pain, swelling, or redness in the mouth, numbness of the jaw, poor healing after dental work, unusual discharge from the  mouth, visible bones in the mouth Severe bone, joint, or muscle pain Side effects that usually do not require medical attention (report to your care team if they continue or are bothersome): Diarrhea Dizziness Headache Nausea Stomach pain Vomiting This list may not describe all possible side effects. Call your doctor for medical advice about side effects. You may report side effects to FDA at 1-800-FDA-1088. Where should I keep my medication? This medication is given in a hospital or clinic. It will not be stored at home. NOTE: This sheet is a summary. It may not cover all possible information. If you have questions about this medicine, talk to your doctor, pharmacist, or health care provider.  2023 Elsevier/Gold Standard (2007-10-06 00:00:00)   Teriparatide Injection What is this medication? TERIPARATIDE (terr ih PAR a tyd) treats osteoporosis. It works by Paramedic stronger and less likely to break (fracture). This medicine may be used for other purposes; ask your health care provider or pharmacist if you have questions. COMMON BRAND NAME(S): FORTEO What should I tell my care team before I take this medication? They need to know if you have any of these conditions: Bone disease other than osteoporosis High levels of calcium in the blood History of cancer in the bone Kidney stone Paget's disease Parathyroid disease Receiving radiation therapy An unusual or allergic reaction to teriparatide, other medications, foods, dyes, or preservatives Pregnant or trying to get pregnant Breast-feeding How should I use this medication? This medication is injected under the skin. You will be taught how to prepare and give it. Take it as directed on the prescription label at the same time every day. Keep taking it unless your care team tells you to stop. This medication comes with INSTRUCTIONS FOR USE. Ask your pharmacist for directions on how to use this medication. Read the information  carefully. Talk to your pharmacist or care team if you have questions. It is important that you put your used needles and pens in a special sharps container. Do not put them in a trash can. If you do not have a sharps container, call your pharmacist or care team to get one. A special MedGuide will be given to you by the pharmacist with each prescription and refill. Be sure to read this information carefully each time. Talk to your care team about the use of this medication in children. Special care may be needed. Overdosage: If you think you have taken too much of this medicine contact a poison control center or emergency room at once. NOTE: This medicine is only for you. Do not share this medicine with others. What if I miss a dose? If you miss a dose, take it as soon as you can. If it is almost time for your next dose, take only that dose. Do not take double or extra doses. What may interact with this medication? Digoxin This list may not describe all possible interactions. Give your health care provider a list of all the medicines, herbs, non-prescription drugs, or dietary supplements you use. Also tell them if you smoke, drink alcohol, or use illegal drugs. Some items may interact with your medicine. What should I watch for while using this medication? Visit your care team for regular checks  on your progress. You may need blood work while you are taking this medication. You should make sure you get enough calcium and vitamin D while you are taking this medication. Discuss the foods you eat and the vitamins you take with your care team. This medication may affect your coordination, reaction time, or judgment. Do not drive or operate machinery until you know how this medication affects you. Sit up or stand slowly to reduce the risk of dizzy or fainting spells. Drinking alcohol with this medication can increase the risk of these side effects. Talk to your care team about your risk of cancer. You may  be more at risk for certain types of cancers if you take this medication. What side effects may I notice from receiving this medication? Side effects that you should report to your care team as soon as possible: Allergic reactions--skin rash, itching, hives, swelling of the face, lips, tongue, or throat High calcium level--increased thirst or amount of urine, nausea, vomiting, confusion, unusual weakness or fatigue, bone pain Kidney stones--blood in the urine, pain or trouble passing urine, pain in the lower back or sides Low blood pressure--dizziness, feeling faint or lightheaded, blurry vision Side effects that usually do not require medical attention (report these to your care team if they continue or are bothersome): Dizziness Headache Joint pain Nausea Pain, redness, or irritation at injection site This list may not describe all possible side effects. Call your doctor for medical advice about side effects. You may report side effects to FDA at 1-800-FDA-1088. Where should I keep my medication? Keep out of the reach of children and pets. Store the pens in the refrigerator. Do not freeze. Use the pen quickly after taking out of the refrigerator and recap and return to refrigerator right after using. Protect from light. Get rid of any unused medication 28 days after the first injection from the pen. Get rid of any unopened, unused medication after the expiration date on the label. To get rid of medications that are no longer needed or have expired: Take the medication to a medication take-back program. Check with your pharmacy or law enforcement to find a location. If you cannot return the medication, ask your pharmacist or care team how to get rid of this medication safely. NOTE: This sheet is a summary. It may not cover all possible information. If you have questions about this medicine, talk to your doctor, pharmacist, or health care provider.  2023 Elsevier/Gold Standard (2021-11-23  00:00:00)   Denosumab Injection (Osteoporosis) What is this medication? DENOSUMAB (den oh SUE mab) prevents and treats osteoporosis. It works by Paramedic stronger and less likely to break (fracture). It is a monoclonal antibody. This medicine may be used for other purposes; ask your health care provider or pharmacist if you have questions. COMMON BRAND NAME(S): Prolia What should I tell my care team before I take this medication? They need to know if you have any of these conditions: Dental or gum disease, or plan to have dental surgery or a tooth pulled Infection Kidney disease Low levels of calcium or vitamin D in your blood On dialysis Poor nutrition Skin conditions Thyroid disease, or have had thyroid or parathyroid surgery Trouble absorbing minerals in your stomach or intestine An unusual reaction to denosumab, other medications, foods, dyes, or preservatives Pregnant or trying to get pregnant Breast-feeding How should I use this medication? This medication is injected under the skin. It is given by your care team in a hospital or  clinic setting. A special MedGuide will be given to you before each treatment. Be sure to read this information carefully each time. Talk to your care team about the use of this medication in children. Special care may be needed. Overdosage: If you think you have taken too much of this medicine contact a poison control center or emergency room at once. NOTE: This medicine is only for you. Do not share this medicine with others. What if I miss a dose? Keep appointments for follow-up doses. It is important not to miss your dose. Call your care team if you are unable to keep an appointment. What may interact with this medication? Do not take this medication with any of the following: Other medications that contain denosumab This medication may also interact with the following: Medications that lower your chance of fighting infection Steroid  medications, such as prednisone or cortisone This list may not describe all possible interactions. Give your health care provider a list of all the medicines, herbs, non-prescription drugs, or dietary supplements you use. Also tell them if you smoke, drink alcohol, or use illegal drugs. Some items may interact with your medicine. What should I watch for while using this medication? Your condition will be monitored carefully while you are receiving this medication. You may need blood work while taking this medication. This medication may increase your risk of getting an infection. Call your care team for advice if you get a fever, chills, sore throat, or other symptoms of a cold or flu. Do not treat yourself. Try to avoid being around people who are sick. Tell your dentist and dental surgeon that you are taking this medication. You should not have major dental surgery while on this medication. See your dentist to have a dental exam and fix any dental problems before starting this medication. Take good care of your teeth while on this medication. Make sure you see your dentist for regular follow-up appointments. You should make sure you get enough calcium and vitamin D while you are taking this medication. Discuss the foods you eat and the vitamins you take with your care team. Talk to your care team if you are pregnant or think you might be pregnant. This medication can cause serious birth defects if taken during pregnancy and for 5 months after the last dose. You will need a negative pregnancy test before starting this medication. Contraception is recommended while taking this medication and for 5 months after the last dose. Your care team can help you find the option that works for you. Talk to your care team before breastfeeding. Changes to your treatment plan may be needed. What side effects may I notice from receiving this medication? Side effects that you should report to your care team as soon as  possible: Allergic reactions--skin rash, itching, hives, swelling of the face, lips, tongue, or throat Infection--fever, chills, cough, sore throat, wounds that don't heal, pain or trouble when passing urine, general feeling of discomfort or being unwell Low calcium level--muscle pain or cramps, confusion, tingling, or numbness in the hands or feet Osteonecrosis of the jaw--pain, swelling, or redness in the mouth, numbness of the jaw, poor healing after dental work, unusual discharge from the mouth, visible bones in the mouth Severe bone, joint, or muscle pain Skin infection--skin redness, swelling, warmth, or pain Side effects that usually do not require medical attention (report these to your care team if they continue or are bothersome): Back pain Headache Joint pain Muscle pain Pain in the  hands, arms, legs, or feet Runny or stuffy nose Sore throat This list may not describe all possible side effects. Call your doctor for medical advice about side effects. You may report side effects to FDA at 1-800-FDA-1088. Where should I keep my medication? This medication is given in a hospital or clinic. It will not be stored at home. NOTE: This sheet is a summary. It may not cover all possible information. If you have questions about this medicine, talk to your doctor, pharmacist, or health care provider.  2023 Elsevier/Gold Standard (2021-12-27 00:00:00)  Alendronate Tablets What is this medication? ALENDRONATE (a LEN droe nate) prevents and treats osteoporosis. It may also be used to treat Paget disease of the bone. It works by Paramedic stronger and less likely to break (fracture). It belongs to a group of medications called bisphosphonates. This medicine may be used for other purposes; ask your health care provider or pharmacist if you have questions. COMMON BRAND NAME(S): Fosamax What should I tell my care team before I take this medication? They need to know if you have any of  these conditions: Bleeding disorder Cancer Dental disease Difficulty swallowing Infection (fever, chills, cough, sore throat, pain or trouble passing urine) Kidney disease Low levels of calcium or other minerals in the blood Low red blood cell counts Receiving steroids like dexamethasone or prednisone Stomach or intestine problems Trouble sitting or standing for 30 minutes An unusual or allergic reaction to alendronate, other medications, foods, dyes or preservatives Pregnant or trying to get pregnant Breast-feeding How should I use this medication? Take this medication by mouth with a full glass of water. Take it as directed on the prescription label at the same time every day. Take the dose right after waking up. Do not eat or drink anything before taking it. Do not take it with any other drink except water. Do not chew or crush the tablet. After taking it, do not eat breakfast, drink, or take any other medications or vitamins for at least 30 minutes. Sit or stand up for at least 30 minutes after you take it. Do not lie down. Keep taking it unless your care team tells you to stop. A special MedGuide will be given to you by the pharmacist with each prescription and refill. Be sure to read this information carefully each time. Talk to your care team about the use of this medication in children. Special care may be needed. Overdosage: If you think you have taken too much of this medicine contact a poison control center or emergency room at once. NOTE: This medicine is only for you. Do not share this medicine with others. What if I miss a dose? If you take your medication once a day, skip it. Take your next dose at the scheduled time the next morning. Do not take two doses on the same day. If you take your medication once a week, take the missed dose on the morning after you remember. Do not take two doses on the same day. What may interact with this medication? Aluminum  hydroxide Antacids Aspirin Calcium supplements Medications for inflammation like ibuprofen, naproxen, and others Iron supplements Magnesium supplements Vitamins with minerals This list may not describe all possible interactions. Give your health care provider a list of all the medicines, herbs, non-prescription drugs, or dietary supplements you use. Also tell them if you smoke, drink alcohol, or use illegal drugs. Some items may interact with your medicine. What should I watch for while using this  medication? Visit your care team for regular checks on your progress. It may be some time before you see the benefit from this medication. Some people who take this medication have severe bone, joint, or muscle pain. This medication may also increase your risk for jaw problems or a broken thigh bone. Tell your care team right away if you have severe pain in your jaw, bones, joints, or muscles. Tell you care team if you have any pain that does not go away or that gets worse. Tell your dentist and dental surgeon that you are taking this medication. You should not have major dental surgery while on this medication. See your dentist to have a dental exam and fix any dental problems before starting this medication. Take good care of your teeth while on this medication. Make sure you see your dentist for regular follow-up appointments. You should make sure you get enough calcium and vitamin D while you are taking this medication. Discuss the foods you eat and the vitamins you take with your care team. You may need blood work done while you are taking this medication. What side effects may I notice from receiving this medication? Side effects that you should report to your care team as soon as possible: Allergic reactions--skin rash, itching, hives, swelling of the face, lips, tongue, or throat Low calcium level--muscle pain or cramps, confusion, tingling, or numbness in the hands or feet Osteonecrosis of the  jaw--pain, swelling, or redness in the mouth, numbness of the jaw, poor healing after dental work, unusual discharge from the mouth, visible bones in the mouth Pain or trouble swallowing Severe bone, joint, or muscle pain Stomach bleeding--bloody or black, tar-like stools, vomiting blood or brown material that looks like coffee grounds Side effects that usually do not require medical attention (report to your care team if they continue or are bothersome): Constipation Diarrhea Nausea Stomach pain This list may not describe all possible side effects. Call your doctor for medical advice about side effects. You may report side effects to FDA at 1-800-FDA-1088. Where should I keep my medication? Keep out of the reach of children and pets. Store at room temperature between 15 and 30 degrees C (59 and 86 degrees F). Throw away any unused medication after the expiration date. NOTE: This sheet is a summary. It may not cover all possible information. If you have questions about this medicine, talk to your doctor, pharmacist, or health care provider.  2023 Elsevier/Gold Standard (2007-10-06 00:00:00)          Osteoporosis  Osteoporosis happens when the bones become thin and less dense than normal. Osteoporosis makes bones more brittle and fragile and more likely to break (fracture). Over time, osteoporosis can cause your bones to become so weak that they fracture after a minor fall. Bones in the hip, wrist, and spine are most likely to fracture due to osteoporosis. What are the causes? The exact cause of this condition is not known. What increases the risk? You are more likely to develop this condition if you: Have family members with this condition. Have poor nutrition. Use the following: Steroid medicines, such as prednisone. Anti-seizure medicines. Nicotine or tobacco, such as cigarettes, e-cigarettes, and chewing tobacco. Are female. Are age 33 or older. Are not physically active  (are sedentary). Are of European or Asian descent. Have a small body frame. What are the signs or symptoms? A fracture might be the first sign of osteoporosis, especially if the fracture results from a fall or injury  that usually would not cause a bone to break. Other signs and symptoms include: Pain in the neck or low back. Stooped posture. Loss of height. How is this diagnosed? This condition may be diagnosed based on: Your medical history. A physical exam. A bone mineral density test, also called a DXA or DEXA test (dual-energy X-ray absorptiometry test). This test uses X-rays to measure the amount of minerals in your bones. How is this treated? This condition may be treated by: Making lifestyle changes, such as: Including foods with more calcium and vitamin D in your diet. Doing weight-bearing and muscle-strengthening exercises. Stopping tobacco use. Limiting alcohol intake. Taking medicine to slow the process of bone loss or to increase bone density. Taking daily supplements of calcium and vitamin D. Taking hormone replacement medicines, such as estrogen for women and testosterone for men. Monitoring your levels of calcium and vitamin D. The goal of treatment is to strengthen your bones and lower your risk for a fracture. Follow these instructions at home: Eating and drinking Include calcium and vitamin D in your diet. Calcium is important for bone health, and vitamin D helps your body absorb calcium. Good sources of calcium and vitamin D include: Certain fatty fish, such as salmon and tuna. Products that have calcium and vitamin D added to them (are fortified), such as fortified cereals. Egg yolks. Cheese. Liver.  Activity Do exercises as told by your health care provider. Ask your health care provider what exercises and activities are safe for you. You should do: Exercises that make you work against gravity (weight-bearing exercises), such as tai chi, yoga, or  walking. Exercises to strengthen muscles, such as lifting weights. Lifestyle Do not drink alcohol if: Your health care provider tells you not to drink. You are pregnant, may be pregnant, or are planning to become pregnant. If you drink alcohol: Limit how much you use to: 0-1 drink a day for women. 0-2 drinks a day for men. Know how much alcohol is in your drink. In the [place] one drink equals one 12 oz bottle of beer (355 mL), one 5 oz glass of wine (148 mL), or one 1 oz glass of hard liquor (44 mL). Do not use any products that contain nicotine or tobacco, such as cigarettes, e-cigarettes, and chewing tobacco. If you need help quitting, ask your health care provider. Preventing falls Use devices to help you move around (mobility aids) as needed, such as canes, walkers, scooters, or crutches. Keep rooms well-lit and clutter-free. Remove tripping hazards from walkways, including cords and throw rugs. Install grab bars in bathrooms and safety rails on stairs. Use rubber mats in the bathroom and other areas that are often wet or slippery. Wear closed-toe shoes that fit well and support your feet. Wear shoes that have rubber soles or low heels. Review your medicines with your health care provider. Some medicines can cause dizziness or changes in blood pressure, which can increase your risk of falling. General instructions Take over-the-counter and prescription medicines only as told by your health care provider. Keep all follow-up visits. This is important. Contact a health care provider if: You have never been screened for osteoporosis and you are: A woman who is age 47 or older. A man who is age 68 or older. Get help right away if: You fall or injure yourself. Summary Osteoporosis is thinning and loss of density in your bones. This makes bones more brittle and fragile and more likely to break (fracture),even with minor falls. The goal of  treatment is to strengthen your bones and lower  your risk for a fracture. Include calcium and vitamin D in your diet. Calcium is important for bone health, and vitamin D helps your body absorb calcium. Talk with your health care provider about screening for osteoporosis if you are a woman who is age 53 or older, or a man who is age 37 or older. This information is not intended to replace advice given to you by your health care provider. Make sure you discuss any questions you have with your health care provider. Document Revised: 01/30/2020 Document Reviewed: 01/30/2020 Elsevier Patient Education  [place].

## 2022-04-04 NOTE — Progress Notes (Signed)
GYNECOLOGY  VISIT   HPI: 66 y.o.   Married  Caucasian  female   [id number] with Patient's last menstrual period was 08/29/2008.   here to discuss BMD results.     Patient is concerned about the cause of the osteoporosis.   She does regular exercise.  Dairy intake twice a day.  No diarrhea.  Actually has constipation.  Has a lot of bloating after her colonoscopy.  She is concerned about gluten intolerance.  Had a dental implant in 2016.  Not a smoker.   Narrative & Impression  EXAM: DUAL X-RAY ABSORPTIOMETRY (DXA) FOR BONE MINERAL DENSITY   IMPRESSION: Referring Physician:  Nunzio Cobbs Your patient completed a bone mineral density test using GE Lunar iDXA system (analysis version: 16). Technologist: [place] PATIENT: Name: Melissa Russell, Melissa Russell Patient ID: [id number] Birth Date: Oct 30, 1955 Height: 65.0 in. Sex: Female Measured: 03/30/2022 Weight: 127.0 lbs. Indications: Estrogen Deficient, Postmenopausal Fractures: NONE Treatments: None   ASSESSMENT: The BMD measured at Forearm Radius 33% is 0.480 g/cm2 with a T-score of -4.5. This patient is considered osteoporotic according to [facility] [address]) criteria.   The quality of the exam is good.   Site Region Measured Date Measured Age YA BMD Significant CHANGE T-score Left Forearm Radius 33% 03/30/2022 68.1 -4.5 0.480 g/cm2   AP Spine L1-L4 1/30/2022 68.1 -3.3 0.786 g/cm2   DualFemur Neck Right 03/30/2022 68.1 -2.0 0.763 g/cm2   DualFemur Total Mean 03/30/2022 68.1 -1.6 0.805 g/cm2   World Health Organization [facility]) criteria for post-menopausal, Caucasian Women: Normal       T-score at or above -1 SD Osteopenia   T-score between -1 and -2.5 SD Osteoporosis T-score at or below -2.5 SD   RECOMMENDATION: 1. All patients should optimize calcium and vitamin D intake. 2. Consider FDA-approved medical therapies in postmenopausal women and men aged 65 years and older, based on the following: a. A hip or  vertebral (clinical or morphometric) fracture. b. T-score = -2.5 at the femoral neck or spine after appropriate evaluation to exclude secondary causes. c. Low bone mass (T-score between -1.0 and -2.5 at the femoral neck or spine) and a 10-year probability of a hip fracture = 3% or a 10-year probability of a major osteoporosis-related fracture = 20% based on the [place]-adapted WHO algorithm. d. Clinician judgment and/or patient preferences may indicate treatment for people with 10-year fracture probabilities above or below these levels.   FOLLOW-UP: Patients with diagnosis of osteoporosis or at high risk for fracture should have regular bone mineral density tests.? Patients eligible for Medicare are allowed routine testing every 2 years.? The testing frequency can be increased to one year for patients who have rapidly progressing disease, are receiving or discontinuing medical therapy to restore bone mass, or have additional risk factors.   I have reviewed this study and agree with the findings. [facility]     Electronically Signed   By: Fidela Salisbury M.D.   On: 03/30/2022 10:28      GYNECOLOGIC HISTORY: Patient's last menstrual period was 08/29/2008. 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   Posterior vitreous detachment of both eyes 06/02/2020   Cystoid macular edema of right eye 06/02/2020   Nuclear  sclerotic cataract of right eye 06/02/2020   History of iron deficiency anemia 02/20/2020   Impacted cerumen of right ear 02/20/2020   TACHYCARDIA 06/22/2010   Elevated blood pressure reading in office with white coat syndrome, without diagnosis of hypertension 12/14/2006   ECHOCARDIOGRAM, ABNORMAL 12/14/2006    Past Medical History:  Diagnosis Date   Dental disease    having implants d/t tooth growing up into sinuses   Heart valve disorder    "leaking"   Hypertension    Vitreomacular adhesion of both eyes 02/11/2021   Likely physiologic at this time    Past Surgical History:  Procedure Laterality Date   cararact surgery Left    COLONOSCOPY  07/12/2016   Dr.Gessner    Current Outpatient Medications  Medication Sig Dispense Refill   ketorolac (ACULAR) 0.5 % ophthalmic solution Place 1 drop into the left eye 2 (two) times daily. 9 mL 4   No current facility-administered medications for this visit.     ALLERGIES: Augmentin [amoxicillin-pot clavulanate] and Clindamycin/lincomycin  Family History  Problem Relation Age of Onset   Cancer Mother        bladder cancer   Cancer Father        colon cancer   Colon cancer Father 48   Esophageal cancer Neg Hx    Rectal cancer Neg Hx    Stomach cancer Neg Hx     Social History   Socioeconomic History   Marital status: Married    Spouse name: Eddie Dibbles   Number of children: 0   Years of education: 18   Highest education level: Master's degree (e.g., MA, MS, MEng, MEd, MSW, MBA)  Occupational History   Occupation: Retired.  Tobacco Use   Smoking status: Never   Smokeless tobacco: Never  Vaping Use   Vaping Use: Never used  Substance and Sexual Activity   Alcohol use: No    Alcohol/week: 0.0 standard drinks of alcohol   Drug use: No   Sexual activity: Not Currently    Partners: Male    Birth control/protection: Post-menopausal    Comment: older than 44, less than 5  Other Topics Concern   Not on file  Social History  Narrative   Lives with her husband. She enjoys doing a lot of genealogy research, dancing, reading and fitness class.    Social Determinants of Health   Financial Resource Strain: Low Risk  (08/16/2021)   Overall Financial Resource Strain (CARDIA)    Difficulty of Paying Living Expenses: Not hard at all  Food Insecurity: No Food Insecurity (08/16/2021)   Hunger Vital Sign    Worried About Running Out of Food in the Last Year: Never true    Ran Out of Food in the Last Year: Never true  Transportation Needs: No Transportation Needs (08/16/2021)   PRAPARE - Hydrologist (Medical): No    Lack of Transportation (Non-Medical):  No  Physical Activity: Sufficiently Active (08/16/2021)   Exercise Vital Sign    Days of Exercise per Week: 7 days    Minutes of Exercise per Session: 50 min  Stress: No Stress Concern Present (08/16/2021)   [place]    Feeling of Stress : Not at all  Social Connections: [place] (08/16/2021)   Social Connection and Isolation Panel [NHANES]    Frequency of Communication with Friends and Family: More than three times a week    Frequency of Social Gatherings with Friends and Family: More than three times a week    Attends Religious Services: More than 4 times per year    Active Member of [employer] or Organizations: Yes    Attends Archivist Meetings: More than 4 times per year    Marital Status: Married  Human resources officer Violence: Not At Risk (08/16/2021)   Humiliation, Afraid, Rape, and Kick questionnaire    Fear of Current or Ex-Partner: No    Emotionally Abused: No    Physically Abused: No    Sexually Abused: No    Review of Systems  All other systems reviewed and are negative.   PHYSICAL EXAMINATION:    BP (!) 164/84   Ht 5' 5.5" (1.664 m)   Wt 123 lb (55.8 kg)   LMP 08/29/2008   BMI 20.16 kg/m     General appearance: alert, cooperative  and appears stated age    ASSESSMENT  Severe osteoporosis.  Abdominal bloating.   PLAN  Bone density report discussed.  Counseled regarding osteoporosis, risk factors, treatment options (bisphosphonates, Prolia, Forteo), and fall risk reduction. Reading materials provided. Discussed importance of weight bearing exercise and daily calcium 1200 mg daily and vit D.  (Currently on Vit D 50,000 IU weekly.) Will check PTH, calcium, vit D level, phosphorus, tissue transglutaminase IgA.  Patient does understand that osteoporosis is a silent disease until fracture occurs and understands that there is significant morbidity associated with osteoporotic fracture.  We may refer her to endocrinology after her initial labs are back.    An After Visit Summary was printed and given to the patient.  40 min  total time was spent for this patient encounter, including preparation, face-to-face counseling with the patient, coordination of care, and documentation of the encounter.

## 2022-04-05 ENCOUNTER — Encounter (INDEPENDENT_AMBULATORY_CARE_PROVIDER_SITE_OTHER): Payer: Self-pay | Admitting: Ophthalmology

## 2022-04-05 LAB — PHOSPHORUS: Phosphorus: 3.1 mg/dL (ref 2.1–4.3)

## 2022-04-05 LAB — VITAMIN D 25 HYDROXY (VIT D DEFICIENCY, FRACTURES): Vit D, 25-Hydroxy: 45 ng/mL (ref 30–100)

## 2022-04-05 LAB — PTH, INTACT AND CALCIUM
Calcium: 9.8 mg/dL (ref 8.6–10.4)
PTH: 13 pg/mL — ABNORMAL LOW (ref 16–77)

## 2022-04-05 LAB — TISSUE TRANSGLUTAMINASE, IGA: (tTG) Ab, IgA: <1 U/mL

## 2022-04-07 ENCOUNTER — Encounter: Payer: Self-pay | Admitting: Obstetrics and Gynecology

## 2022-04-07 ENCOUNTER — Telehealth: Payer: Self-pay

## 2022-04-07 ENCOUNTER — Other Ambulatory Visit: Payer: Self-pay

## 2022-04-07 DIAGNOSIS — R7989 Other specified abnormal findings of blood chemistry: Secondary | ICD-10-CM

## 2022-04-07 DIAGNOSIS — M818 Other osteoporosis without current pathological fracture: Secondary | ICD-10-CM

## 2022-04-07 NOTE — Telephone Encounter (Signed)
Referral placed.  Patient messaged in My Chart and was provided phone number to call to arrange appt.

## 2022-04-07 NOTE — Telephone Encounter (Signed)
Per Dr. Elza Rafter result note: "Please make a referral for my patient to see endocrinology in [facility].  She has a low PTH and severe osteoporosis."

## 2022-04-12 NOTE — Telephone Encounter (Signed)
See 04/07/22 my chart message.

## 2022-04-12 NOTE — Telephone Encounter (Signed)
8/10 23 My Chart message:  Thamas Jaegers, RMA to [facility]      04/12/22  2:11 PM Hi Charlett Nose,   I will fax your referral to Dr.Balan they will reach out to call you once referral reviewed. Her office # is [phone] to keep on hand.   Last read by Pershing Cox at  2:14 PM on 04/12/2022.

## 2022-04-12 NOTE — Telephone Encounter (Signed)
[place] for patient to see first available for endocrinology.  She could see Dr. Chalmers Cater or Dr. Dwyane Dee.

## 2022-05-03 ENCOUNTER — Telehealth: Payer: Self-pay

## 2022-05-03 NOTE — Telephone Encounter (Signed)
Patient called in voice mail stating Dr. Chalmers Cater wants her to have labs drawn and she wants to know if she can bring the piece of paper with labs on it and have drawn in our office.  I called her back at 2:30 and man who answered asked me to call her after 3:30pm when she will be home.  I called her at 4:10pm and left message and asked her to call us back and to let me know the names of the labs Dr. Chalmers Cater requested so we can see if we can help her with this.

## 2022-05-04 NOTE — Telephone Encounter (Signed)
The labs will need to be ordered directly by Dr. Chalmers Cater and not through this office.  She will need to do the blood work where Dr. Almetta Lovely name is attached to the results.

## 2022-05-04 NOTE — Telephone Encounter (Signed)
Dr.Silva see the previous documentation below patient dropped lab orders off for the following labs Calcium ionized serum, N-telopeptide serum, C-telopeptide serum Dx code M81.0. I did check with Elmyra Ricks in the labs to make sure labs could be done here and she confirmed they can.

## 2022-05-04 NOTE — Telephone Encounter (Signed)
Patient had visit with Dr.Balan on 05/03/22

## 2022-05-04 NOTE — Telephone Encounter (Signed)
Patient called back with labs names however she did not have a diagnosis to associate the labs.  She is going to pickup the paper from [facility] office and will drop the paper off at our office so we can send to [place].

## 2022-05-04 NOTE — Telephone Encounter (Signed)
Patient informed. 

## 2022-06-02 ENCOUNTER — Encounter (INDEPENDENT_AMBULATORY_CARE_PROVIDER_SITE_OTHER): Payer: Medicare Other | Admitting: Ophthalmology

## 2022-06-16 ENCOUNTER — Encounter: Payer: Self-pay | Admitting: Obstetrics and Gynecology

## 2022-06-16 NOTE — Telephone Encounter (Signed)
Please place the requested referral for my patient.

## 2022-06-17 NOTE — Telephone Encounter (Signed)
Left message for Melissa Russell to call me back.

## 2022-06-21 ENCOUNTER — Encounter (INDEPENDENT_AMBULATORY_CARE_PROVIDER_SITE_OTHER): Payer: Medicare Other | Admitting: Ophthalmology

## 2022-08-17 ENCOUNTER — Ambulatory Visit (INDEPENDENT_AMBULATORY_CARE_PROVIDER_SITE_OTHER): Payer: Medicare Other | Admitting: Family Medicine

## 2022-08-17 DIAGNOSIS — Z0000 Encounter for general adult medical examination without abnormal findings: Secondary | ICD-10-CM

## 2022-08-17 NOTE — Progress Notes (Signed)
MEDICARE ANNUAL WELLNESS VISIT  08/17/2022  Telephone Visit Disclaimer This Medicare AWV was conducted by telephone due to national recommendations for restrictions regarding the COVID-19 Pandemic (e.g. social distancing).  I verified, using two identifiers, that I am speaking with Melissa Russell or their authorized healthcare agent. I discussed the limitations, risks, security, and privacy concerns of performing an evaluation and management service by telephone and the potential availability of an in-person appointment in the future. The patient expressed understanding and agreed to proceed.  Location of Patient: Home Location of Provider (nurse):  In the office.  Subjective:    Melissa Russell is a 66 y.o. female patient of Luetta Nutting, DO who had a Medicare Annual Wellness Visit today via telephone. Melissa Russell is Retired and lives with their spouse in [place] independent facility. she does not have any children. she reports that she is socially active and does interact with friends/family regularly. she is moderately physically active and enjoys  doing a lot of genealogy research, dancing, reading and fitness class.   Patient Care Team: Luetta Nutting, DO as PCP - General (Family Medicine) Nunzio Cobbs, MD as Consulting Physician (Obstetrics and Gynecology)     08/17/2022   10:00 AM 08/16/2021   10:05 AM 02/20/2020   10:53 AM 07/12/2016    1:14 PM 06/28/2016    9:51 AM  Advanced Directives  Does Patient Have a Medical Advance Directive? Yes No No Yes Yes  Type of Advance Directive Living will    [place];Living will  Does patient want to make changes to medical advance directive? No - Patient declined      Copy of [place] in Chart?     No - copy requested  Would patient like information on creating a medical advance directive?  No - Patient declined No - Patient declined      Hospital Utilization Over the Past 12  Months: # of hospitalizations or ER visits: 0 # of surgeries: 0  Review of Systems    Patient reports that her overall health is unchanged compared to last year.  History obtained from chart review and the patient  Patient Reported Readings (BP, Pulse, CBG, Weight, etc) none  Pain Assessment Pain : No/denies pain     Current Medications & Allergies (verified) Allergies as of 08/17/2022       Reactions   Augmentin [amoxicillin-pot Clavulanate]    Causes thrush   Clindamycin/lincomycin         Medication List        Accurate as of August 17, 2022 10:10 AM. If you have any questions, ask your nurse or doctor.          ketorolac 0.5 % ophthalmic solution Commonly known as: ACULAR Place 1 drop into the left eye in the morning and at bedtime.        History (reviewed): Past Medical History:  Diagnosis Date   Dental disease    having implants d/t tooth growing up into sinuses   Heart valve disorder    "leaking"   Hypertension    Osteoporosis    Vitreomacular adhesion of both eyes 02/11/2021   Likely physiologic at this time   Past Surgical History:  Procedure Laterality Date   cararact surgery Left    COLONOSCOPY  07/12/2016   Dr.Gessner   Family History  Problem Relation Age of Onset   Cancer Mother        bladder cancer  Cancer Father        colon cancer   Colon cancer Father 40   Esophageal cancer Neg Hx    Rectal cancer Neg Hx    Stomach cancer Neg Hx    Social History   Socioeconomic History   Marital status: Married    Spouse name: Eddie Dibbles   Number of children: 0   Years of education: 18   Highest education level: Master's degree (e.g., MA, MS, MEng, MEd, MSW, MBA)  Occupational History   Occupation: Retired.  Tobacco Use   Smoking status: Never   Smokeless tobacco: Never  Vaping Use   Vaping Use: Never used  Substance and Sexual Activity   Alcohol use: No    Alcohol/week: 0.0 standard drinks of alcohol   Drug use: No    Sexual activity: Not Currently    Partners: Male    Birth control/protection: Post-menopausal    Comment: older than 3, less than 5  Other Topics Concern   Not on file  Social History Narrative   Lives with her husband. She enjoys doing a lot of genealogy research, dancing, reading and fitness class.    Social Determinants of Health   Financial Resource Strain: Low Risk  (08/17/2022)   Overall Financial Resource Strain (CARDIA)    Difficulty of Paying Living Expenses: Not hard at all  Food Insecurity: No Food Insecurity (08/17/2022)   Hunger Vital Sign    Worried About Running Out of Food in the Last Year: Never true    Ran Out of Food in the Last Year: Never true  Transportation Needs: No Transportation Needs (08/17/2022)   PRAPARE - Hydrologist (Medical): No    Lack of Transportation (Non-Medical): No  Physical Activity: Sufficiently Active (08/17/2022)   Exercise Vital Sign    Days of Exercise per Week: 7 days    Minutes of Exercise per Session: 60 min  Stress: No Stress Concern Present (08/17/2022)   [place]    Feeling of Stress : Not at all  Social Connections: [place] (08/17/2022)   Social Connection and Isolation Panel [NHANES]    Frequency of Communication with Friends and Family: More than three times a week    Frequency of Social Gatherings with Friends and Family: More than three times a week    Attends Religious Services: More than 4 times per year    Active Member of [employer] or Organizations: Yes    Attends Archivist Meetings: More than 4 times per year    Marital Status: Married    Activities of Daily Living    08/17/2022   10:04 AM  In your present state of health, do you have any difficulty performing the following activities:  Hearing? 0  Vision? 0  Difficulty concentrating or making decisions? 0  Walking or climbing stairs? 0   Dressing or bathing? 0  Doing errands, shopping? 0  Preparing Food and eating ? N  Using the Toilet? N  In the past six months, have you accidently leaked urine? N  Do you have problems with loss of bowel control? N  Managing your Medications? N  Managing your Finances? N  Housekeeping or managing your Housekeeping? N    Patient Education/ Literacy How often do you need to have someone help you when you read instructions, pamphlets, or other written materials from your doctor or pharmacy?: 1 - Never What is the last grade level you  completed in school?: Masters degree  Exercise Current Exercise Habits: Home exercise routine;Structured exercise class, Type of exercise: walking;strength training/weights;stretching, Time (Minutes): 60, Frequency (Times/Week): 7, Weekly Exercise (Minutes/Week): 420, Intensity: Moderate, Exercise limited by: None identified  Diet Patient reports consuming 3 meals a day and 1 snack(s) a day Patient reports that her primary diet is: Regular Patient reports that she does have regular access to food.   Depression Screen    08/17/2022   10:00 AM 08/16/2021   10:06 AM 02/20/2020   10:54 AM  PHQ 2/9 Scores  PHQ - 2 Score 0 0 0  PHQ- 9 Score   0     Fall Risk    08/17/2022   10:00 AM 08/16/2021   10:06 AM 12/07/2020   10:34 AM 04/23/2020   10:59 AM  Fall Risk   Falls in the past year? 0 0 0 0  Number falls in past yr: 0 0 0 0  Injury with Fall? 0 0 0 0  Risk for fall due to : No Fall Risks No Fall Risks No Fall Risks   Follow up Falls evaluation completed Falls evaluation completed Falls evaluation completed      Objective:  Melissa Russell seemed alert and oriented and she participated appropriately during our telephone visit.  Blood Pressure Weight BMI  BP Readings from Last 3 Encounters:  04/04/22 (!) 164/84  11/25/21 130/82  10/20/21 (!) 154/69   Wt Readings from Last 3 Encounters:  04/04/22 123 lb (55.8 kg)  11/25/21 125 lb 6.4 oz  (56.9 kg)  10/20/21 123 lb (55.8 kg)   BMI Readings from Last 1 Encounters:  04/04/22 20.16 kg/m    *Unable to obtain current vital signs, weight, and BMI due to telephone visit type  Hearing/Vision  Ethelean did not seem to have difficulty with hearing/understanding during the telephone conversation Reports that she has had a formal eye exam by an eye care professional within the past year Reports that she has not had a formal hearing evaluation within the past year *Unable to fully assess hearing and vision during telephone visit type  Cognitive Function:    08/17/2022   10:05 AM 08/16/2021   10:14 AM  6CIT Screen  What Year? 0 points 0 points  What month? 0 points 0 points  What time? 0 points 0 points  Count back from 20 0 points 0 points  Months in reverse 0 points 0 points  Repeat phrase 0 points 0 points  Total Score 0 points 0 points   (Normal:0-7, Significant for Dysfunction: >8)  Normal Cognitive Function Screening: Yes   Immunization & Health Maintenance Record Immunization History  Administered Date(s) Administered   PFIZER(Purple Top)SARS-COV-2 Vaccination 09/09/2019, 09/28/2019   Td 01/02/2006   Tdap 04/18/2016    Health Maintenance  Topic Date Due   COVID-19 Vaccine (3 - Pfizer risk series) 09/02/2022 (Originally 10/26/2019)   Zoster Vaccines- Shingrix (1 of 2) 11/16/2022 (Originally [birth year]29/1975)   INFLUENZA VACCINE  11/27/2022 (Originally 03/29/2022)   Pneumonia Vaccine 72+ Years old (1 - PCV) 08/18/2023 (Originally 01/30/2019)   Hepatitis C Screening  08/18/2023 (Originally 01/30/1974)   Medicare Annual Wellness (AWV)  08/18/2023   MAMMOGRAM  03/30/2024   DTaP/Tdap/Td (3 - Td or Tdap) 04/18/2026   COLONOSCOPY (Pts 45-64yr Insurance coverage will need to be confirmed)  10/21/2031   DEXA SCAN  Completed   HPV VACCINES  Aged Out       Assessment  This is a routine wellness examination for  Melissa Russell.  Health Maintenance: Due or Overdue There are no  preventive care reminders to display for this patient.   Melissa Russell does not need a referral for Community Assistance: Care Management:   no Social Work:    no Prescription Assistance:  no Nutrition/Diabetes Education:  no   Plan:  Personalized Goals  Goals Addressed               This Visit's Progress     Patient Stated (pt-stated)        Patient stated that she would like to work on her bones and get her bone density higher.       Personalized Health Maintenance & Screening Recommendations  Pneumococcal vaccine  Influenza vaccine Shingrix vaccine  Patient declined the vaccines at this time.  Lung Cancer Screening Recommended: no (Low Dose CT Chest recommended if Age 65-80 years, 30 pack-year currently smoking OR have quit w/in past 15 years) Hepatitis C Screening recommended: yes HIV Screening recommended: no  Advanced Directives: Written information was not prepared per patient's request.  Referrals & Orders No orders of the defined types were placed in this encounter.   Follow-up Plan Follow-up with Luetta Nutting, DO as planned Medicare wellness visit in one year.  Patient will access AVS on my chart.   I have personally reviewed and noted the following in the patient's chart:   Medical and social history Use of alcohol, tobacco or illicit drugs  Current medications and supplements Functional ability and status Nutritional status Physical activity Advanced directives List of other physicians Hospitalizations, surgeries, and ER visits in previous 12 months Vitals Screenings to include cognitive, depression, and falls Referrals and appointments  In addition, I have reviewed and discussed with Melissa Russell certain preventive protocols, quality metrics, and best practice recommendations. A written personalized care plan for preventive services as well as general preventive health recommendations is available and can be mailed to the patient at  her request.      Tinnie Gens, RN BSN  08/17/2022

## 2022-08-17 NOTE — Patient Instructions (Addendum)
[facility] Maintenance Summary and Written Plan of Care  Ms. Melissa Russell ,  Thank you for allowing me to perform your Medicare Annual Wellness Visit and for your ongoing commitment to your health.   Health Maintenance & Immunization History Health Maintenance  Topic Date Due  . COVID-19 Vaccine (3 - Pfizer risk series) 09/02/2022 (Originally 10/26/2019)  . Zoster Vaccines- Shingrix (1 of 2) 11/16/2022 (Originally [birth year]29/1975)  . INFLUENZA VACCINE  11/27/2022 (Originally 03/29/2022)  . Pneumonia Vaccine 2+ Years old (1 - PCV) 08/18/2023 (Originally 01/30/2019)  . Hepatitis C Screening  08/18/2023 (Originally 01/30/1974)  . Medicare Annual Wellness (AWV)  08/18/2023  . MAMMOGRAM  03/30/2024  . DTaP/Tdap/Td (3 - Td or Tdap) 04/18/2026  . COLONOSCOPY (Pts 45-34yr Insurance coverage will need to be confirmed)  10/21/2031  . DEXA SCAN  Completed  . HPV VACCINES  Aged Out   Immunization History  Administered Date(s) Administered  . PFIZER(Purple Top)SARS-COV-2 Vaccination 09/09/2019, 09/28/2019  . Td 01/02/2006  . Tdap 04/18/2016    These are the patient goals that we discussed:  Goals Addressed              This Visit's Progress   .  Patient Stated (pt-stated)        Patient stated that she would like to work on her bones and get her bone density higher.        This is a list of Health Maintenance Items that are overdue or due now: There are no preventive care reminders to display for this patient.    Orders/Referrals Placed Today: No orders of the defined types were placed in this encounter.  (Contact our referral department at [phone] you have not spoken with someone about your referral appointment within the next 5 days)    Follow-up Plan Follow-up with MLuetta Nutting DO as planned Medicare wellness visit in one year.  Patient will access AVS on my chart.      Health Maintenance, Female Adopting a healthy lifestyle and getting  preventive care are important in promoting health and wellness. Ask your health care provider about: The right schedule for you to have regular tests and exams. Things you can do on your own to prevent diseases and keep yourself healthy. What should I know about diet, weight, and exercise? Eat a healthy diet  Eat a diet that includes plenty of vegetables, fruits, low-fat dairy products, and lean protein. Do not eat a lot of foods that are high in solid fats, added sugars, or sodium. Maintain a healthy weight Body mass index (BMI) is used to identify weight problems. It estimates body fat based on height and weight. Your health care provider can help determine your BMI and help you achieve or maintain a healthy weight. Get regular exercise Get regular exercise. This is one of the most important things you can do for your health. Most adults should: Exercise for at least 150 minutes each week. The exercise should increase your heart rate and make you sweat (moderate-intensity exercise). Do strengthening exercises at least twice a week. This is in addition to the moderate-intensity exercise. Spend less time sitting. Even light physical activity can be beneficial. Watch cholesterol and blood lipids Have your blood tested for lipids and cholesterol at 68years of age, then have this test every 5 years. Have your cholesterol levels checked more often if: Your lipid or cholesterol levels are high. You are older than 68years of age. You are at high risk  for heart disease. What should I know about cancer screening? Depending on your health history and family history, you may need to have cancer screening at various ages. This may include screening for: Breast cancer. Cervical cancer. Colorectal cancer. Skin cancer. Lung cancer. What should I know about heart disease, diabetes, and high blood pressure? Blood pressure and heart disease High blood pressure causes heart disease and increases the  risk of stroke. This is more likely to develop in people who have high blood pressure readings or are overweight. Have your blood pressure checked: Every 3-5 years if you are 3-64 years of age. Every year if you are 79 years old or older. Diabetes Have regular diabetes screenings. This checks your fasting blood sugar level. Have the screening done: Once every three years after age 24 if you are at a normal weight and have a low risk for diabetes. More often and at a younger age if you are overweight or have a high risk for diabetes. What should I know about preventing infection? Hepatitis B If you have a higher risk for hepatitis B, you should be screened for this virus. Talk with your health care provider to find out if you are at risk for hepatitis B infection. Hepatitis C Testing is recommended for: Everyone born from [birth year] through 1967. Anyone with known risk factors for hepatitis C. Sexually transmitted infections (STIs) Get screened for STIs, including gonorrhea and chlamydia, if: You are sexually active and are younger than 68 years of age. You are older than 68 years of age and your health care provider tells you that you are at risk for this type of infection. Your sexual activity has changed since you were last screened, and you are at increased risk for chlamydia or gonorrhea. Ask your health care provider if you are at risk. Ask your health care provider about whether you are at high risk for HIV. Your health care provider may recommend a prescription medicine to help prevent HIV infection. If you choose to take medicine to prevent HIV, you should first get tested for HIV. You should then be tested every 3 months for as long as you are taking the medicine. Pregnancy If you are about to stop having your period (premenopausal) and you may become pregnant, seek counseling before you get pregnant. Take 400 to 800 micrograms (mcg) of folic acid every day if you become pregnant. Ask for  birth control (contraception) if you want to prevent pregnancy. Osteoporosis and menopause Osteoporosis is a disease in which the bones lose minerals and strength with aging. This can result in bone fractures. If you are 47 years old or older, or if you are at risk for osteoporosis and fractures, ask your health care provider if you should: Be screened for bone loss. Take a calcium or vitamin D supplement to lower your risk of fractures. Be given hormone replacement therapy (HRT) to treat symptoms of menopause. Follow these instructions at home: Alcohol use Do not drink alcohol if: Your health care provider tells you not to drink. You are pregnant, may be pregnant, or are planning to become pregnant. If you drink alcohol: Limit how much you have to: 0-1 drink a day. Know how much alcohol is in your drink. In the [place] one drink equals one 12 oz bottle of beer (355 mL), one 5 oz glass of wine (148 mL), or one 1 oz glass of hard liquor (44 mL). Lifestyle Do not use any products that contain nicotine or tobacco. These  products include cigarettes, chewing tobacco, and vaping devices, such as e-cigarettes. If you need help quitting, ask your health care provider. Do not use street drugs. Do not share needles. Ask your health care provider for help if you need support or information about quitting drugs. General instructions Schedule regular health, dental, and eye exams. Stay current with your vaccines. Tell your health care provider if: You often feel depressed. You have ever been abused or do not feel safe at home. Summary Adopting a healthy lifestyle and getting preventive care are important in promoting health and wellness. Follow your health care provider's instructions about healthy diet, exercising, and getting tested or screened for diseases. Follow your health care provider's instructions on monitoring your cholesterol and blood pressure. This information is not intended to replace  advice given to you by your health care provider. Make sure you discuss any questions you have with your health care provider. Document Revised: 01/04/2021 Document Reviewed: 01/04/2021 Elsevier Patient Education  [place].

## 2022-09-11 ENCOUNTER — Ambulatory Visit
Admission: EM | Admit: 2022-09-11 | Discharge: 2022-09-11 | Disposition: A | Discharge status: Home / Self Care | Payer: Medicare Other | Attending: Family Medicine | Admitting: Family Medicine

## 2022-09-11 ENCOUNTER — Other Ambulatory Visit: Payer: Self-pay

## 2022-09-11 ENCOUNTER — Encounter: Payer: Self-pay | Admitting: Emergency Medicine

## 2022-09-11 DIAGNOSIS — R059 Cough, unspecified: Secondary | ICD-10-CM | POA: Diagnosis not present

## 2022-09-11 MED ORDER — BENZONATATE 200 MG PO CAPS: 200.0000 mg | ORAL_CAPSULE | Freq: Three times a day (TID) | ORAL | 0 refills | Status: AC | PRN

## 2022-09-11 MED ORDER — DOXYCYCLINE HYCLATE 100 MG PO CAPS: 100.0000 mg | ORAL_CAPSULE | Freq: Two times a day (BID) | ORAL | 0 refills | Status: AC

## 2022-09-11 NOTE — ED Provider Notes (Signed)
[facility]    CSN: [id number] Arrival date & time: 09/11/22  1342      History   Chief Complaint Chief Complaint  Patient presents with   Cough    HPI Melissa Russell is a 66 y.o. female.   HPI 67 year old female presents with cough for 2.5 weeks patient is concerned with chest infection.  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   Posterior vitreous detachment of both eyes 06/02/2020   Cystoid macular edema of right eye 06/02/2020   Nuclear sclerotic cataract of right eye 06/02/2020   History of iron deficiency anemia 02/20/2020   Impacted cerumen of right ear 02/20/2020   TACHYCARDIA 06/22/2010   Elevated blood pressure reading in office with white coat syndrome, without diagnosis of hypertension 12/14/2006   ECHOCARDIOGRAM, ABNORMAL 12/14/2006    Past Surgical History:  Procedure Laterality Date   cararact surgery Left    COLONOSCOPY  07/12/2016   Dr.Gessner    OB History     Gravida  0   Para  0   Term  0   Preterm  0   AB  0   Living  0      SAB  0   IAB  0   Ectopic  0   Multiple  0   Live Births               Home Medications    Prior to Admission medications   Medication Sig Start Date End Date Taking? Authorizing Provider  benzonatate (TESSALON) 200 MG capsule Take 1 capsule (200 mg total) by mouth 3 (three) times daily as needed for up to 7 days. 09/11/22 09/18/22 Yes Eliezer Lofts, FNP  doxycycline (VIBRAMYCIN) 100 MG capsule Take 1 capsule (100 mg total) by mouth 2 (two) times daily for 7 days. 09/11/22 09/18/22 Yes Eliezer Lofts, FNP  ketorolac (ACULAR) 0.5 % ophthalmic solution Place 1 drop into the left eye in the morning and at bedtime.    [provider]    Family History Family History  Problem Relation Age of Onset   Cancer Mother        bladder cancer   Cancer Father        colon cancer   Colon cancer Father 24   Esophageal cancer Neg Hx    Rectal cancer Neg Hx    Stomach cancer Neg Hx     Social History Social History   Tobacco Use   Smoking status: Never   Smokeless tobacco: Never  Vaping Use   Vaping Use: Never used  Substance Use Topics   Alcohol use: No    Alcohol/week: 0.0 standard drinks of alcohol   Drug use: No     Allergies   Augmentin [amoxicillin-pot clavulanate] and Clindamycin/lincomycin   Review of Systems Review of Systems  Respiratory:  Positive for cough.   All other systems reviewed and are  negative.    Physical Exam Triage Vital Signs ED Triage Vitals  Enc Vitals Group     BP 09/11/22 1402 (!) 166/85     Pulse Rate 09/11/22 1402 (!) 121     Resp 09/11/22 1402 18     Temp 09/11/22 1402 98.9 F (37.2 C)     Temp Source 09/11/22 1402 Oral     SpO2 09/11/22 1402 99 %     Weight 09/11/22 1404 127 lb (57.6 kg)     Height 09/11/22 1404 5' 5.5" (1.664 m)     Head Circumference --      Peak Flow --      Pain Score 09/11/22 1403 4     Pain Loc --      Pain Edu? --      Excl. in [place]? --    No data found.  Updated Vital Signs BP (!) 166/85 (BP Location: Left Arm)   Pulse (!) 121   Temp 98.9 F (37.2 C) (Oral)   Resp 18   Ht 5' 5.5" (1.664 m)   Wt 127 lb (57.6 kg)   LMP 08/29/2008   SpO2 99%   BMI 20.81 kg/m   Visual Acuity Right Eye Distance:   Left Eye Distance:   Bilateral Distance:    Right Eye Near:   Left Eye Near:    Bilateral Near:     Physical Exam Vitals and  nursing note reviewed.  Constitutional:      Appearance: Normal appearance. She is normal weight. She is ill-appearing.  HENT:     Head: Normocephalic and atraumatic.     Right Ear: Tympanic membrane, ear canal and external ear normal.     Left Ear: Tympanic membrane, ear canal and external ear normal.     Nose: Nose normal.     Mouth/Throat:     Mouth: Mucous membranes are moist.     Pharynx: Oropharynx is clear.  Eyes:     Extraocular Movements: Extraocular movements intact.     Conjunctiva/sclera: Conjunctivae normal.     Pupils: Pupils are equal, round, and reactive to light.  Cardiovascular:     Rate and Rhythm: Regular rhythm. Tachycardia present.     Pulses: Normal pulses.     Heart sounds: Normal heart sounds. No murmur heard.    No friction rub. No gallop.  Pulmonary:     Effort: Pulmonary effort is normal.     Breath sounds: Normal breath sounds. No wheezing, rhonchi or rales.     Comments: Infrequent nonproductive cough noted on exam Musculoskeletal:        General: Normal range of motion.     Cervical back: Normal range of motion and neck supple.  Skin:    General: Skin is warm and dry.  Neurological:     General: No focal deficit present.     Mental Status: She is alert and oriented to person, place, and time. Mental status is at baseline.      UC Treatments / Results  Labs (all labs ordered are listed, but only abnormal results are displayed) Labs Reviewed - No data to display  EKG   Radiology No results found.  Procedures Procedures (including critical care time)  Medications Ordered in UC Medications - No data to display  Initial Impression / Assessment and Plan / UC Course  I have reviewed the triage vital signs and the nursing notes.  Pertinent labs & imaging results that were available during my care of the patient  were reviewed by me and considered in my medical decision making (see chart for details).     MDM: 1.  Cough, unspecified  type-Rx'd doxycycline, Tessalon Perles. Instructed patient to take medication as directed with food to completion.  Advised may take Tessalon Perles daily or as needed for cough.  Encouraged patient to increase daily water intake to 64 ounces per day while taking these medications.  Ice patient if symptoms worsen and/or unresolved please follow-up with PCP or here for further evaluation.  Patient discharged home, hemodynamically stable. Final Clinical Impressions(s) / UC Diagnoses   Final diagnoses:  Cough, unspecified type     Discharge Instructions      Instructed patient to take medication as directed with food to completion.  Advised may take Tessalon Perles daily or as needed for cough.  Encouraged patient to increase daily water intake to 64 ounces per day while taking these medications.  Ice patient if symptoms worsen and/or unresolved please follow-up with PCP or here for further evaluation.     ED Prescriptions     Medication Sig Dispense Auth. Provider   doxycycline (VIBRAMYCIN) 100 MG capsule Take 1 capsule (100 mg total) by mouth 2 (two) times daily for 7 days. 14 capsule Eliezer Lofts, FNP   benzonatate (TESSALON) 200 MG capsule Take 1 capsule (200 mg total) by mouth 3 (three) times daily as needed for up to 7 days. 40 capsule Eliezer Lofts, FNP      PDMP not reviewed this encounter.   Eliezer Lofts, [place] 09/11/22 1436

## 2022-09-11 NOTE — ED Triage Notes (Signed)
Cough x 2 1/2 weeks  Denies fever  No OTC meds  Eating oranges and taking zinc  Concerned about chest infection

## 2022-09-11 NOTE — Discharge Instructions (Addendum)
Instructed patient to take medication as directed with food to completion.  Advised may take Tessalon Perles daily or as needed for cough.  Encouraged patient to increase daily water intake to 64 ounces per day while taking these medications.  Ice patient if symptoms worsen and/or unresolved please follow-up with PCP or here for further evaluation.

## 2022-09-12 ENCOUNTER — Telehealth: Payer: Self-pay | Admitting: Emergency Medicine

## 2022-09-12 NOTE — Telephone Encounter (Signed)
LMTRC.  Advised if doing well to disregard the call.  Any questions or concerns, feel free to contact the office. 

## 2022-09-15 ENCOUNTER — Encounter: Payer: Self-pay | Admitting: Family Medicine

## 2022-09-15 DIAGNOSIS — M810 Age-related osteoporosis without current pathological fracture: Secondary | ICD-10-CM

## 2022-09-15 DIAGNOSIS — Z131 Encounter for screening for diabetes mellitus: Secondary | ICD-10-CM

## 2022-09-15 DIAGNOSIS — R7989 Other specified abnormal findings of blood chemistry: Secondary | ICD-10-CM

## 2022-09-15 DIAGNOSIS — Z13220 Encounter for screening for lipoid disorders: Secondary | ICD-10-CM

## 2022-09-15 NOTE — Telephone Encounter (Signed)
Please have her schedule with me.  I have not seen her since 11/2020.  Thanks!

## 2022-09-15 NOTE — Telephone Encounter (Signed)
Pended requested labs (except Telopeptide Serum test, not sure what this is) and looks like she has not had lipid/CMP in over a year, also pended these. Please review and sign if appropriate.

## 2022-09-20 ENCOUNTER — Encounter: Payer: Self-pay | Admitting: Family Medicine

## 2022-09-20 ENCOUNTER — Ambulatory Visit (INDEPENDENT_AMBULATORY_CARE_PROVIDER_SITE_OTHER): Payer: Medicare Other | Admitting: Family Medicine

## 2022-09-20 VITALS — BP 178/92 | HR 116 | Ht 65.5 in | Wt 132.5 lb

## 2022-09-20 DIAGNOSIS — M810 Age-related osteoporosis without current pathological fracture: Secondary | ICD-10-CM

## 2022-09-20 DIAGNOSIS — R7989 Other specified abnormal findings of blood chemistry: Secondary | ICD-10-CM | POA: Diagnosis not present

## 2022-09-20 DIAGNOSIS — R946 Abnormal results of thyroid function studies: Secondary | ICD-10-CM | POA: Diagnosis not present

## 2022-09-20 DIAGNOSIS — E041 Nontoxic single thyroid nodule: Secondary | ICD-10-CM

## 2022-09-20 NOTE — Assessment & Plan Note (Signed)
Seen by endocrinology previously.  Updating labs today.  We discussed optimizing vitamin D and calcium intake.  She is interested in more natural ways for improvement of her bone density.  We discussed weightbearing exercises to help maintain current bone mass.

## 2022-09-20 NOTE — Assessment & Plan Note (Signed)
Recheck TSH, free T4 and obtain ultrasound for thyroid nodule.

## 2022-09-20 NOTE — Progress Notes (Signed)
Melissa Russell - 67 y.o. female MRN [id number]  Date of birth: 1955/09/13  Subjective Chief Complaint  Patient presents with   Thyroid Problem    HPI Melissa Russell is a 67 year old female here today for follow-up visit.  She was seen by endocrinology previously due to osteoporosis.  She reports that they discussed medication options for management of osteoporosis.  She opted not to start anything at that time due to wanting to manage this more "naturally".  She wanted to have updated labs checked from her visit with endocrinology.  Her TSH was noted to be elevated at that time.  She does think she has a small thyroid nodule on the right side of her thyroid.  She is taking calcium and vitamin D supplementation.  ROS:  A comprehensive ROS was completed and negative except as noted per HPI  Allergies  Allergen Reactions   Augmentin [Amoxicillin-Pot Clavulanate]     Causes thrush   Clindamycin/Lincomycin     Past Medical History:  Diagnosis Date   Dental disease    having implants d/t tooth growing up into sinuses   Heart valve disorder    "leaking"   Hypertension    Osteoporosis    Vitreomacular adhesion of both eyes 02/11/2021   Likely physiologic at this time    Past Surgical History:  Procedure Laterality Date   cararact surgery Left    COLONOSCOPY  07/12/2016   Dr.Gessner    Social History   Socioeconomic History   Marital status: Married    Spouse name: Eddie Dibbles   Number of children: 0   Years of education: 18   Highest education level: Master's degree (e.g., MA, MS, MEng, MEd, MSW, MBA)  Occupational History   Occupation: Retired.  Tobacco Use   Smoking status: Never   Smokeless tobacco: Never  Vaping Use   Vaping Use: Never used  Substance and Sexual Activity   Alcohol use: No    Alcohol/week: 0.0 standard drinks of alcohol   Drug use: No   Sexual activity: Not Currently    Partners: Male    Birth control/protection: Post-menopausal    Comment: older than 52,  less than 5  Other Topics Concern   Not on file  Social History Narrative   Lives with her husband. She enjoys doing a lot of genealogy research, dancing, reading and fitness class.    Social Determinants of Health   Financial Resource Strain: Low Risk  (08/17/2022)   Overall Financial Resource Strain (CARDIA)    Difficulty of Paying Living Expenses: Not hard at all  Food Insecurity: No Food Insecurity (08/17/2022)   Hunger Vital Sign    Worried About Running Out of Food in the Last Year: Never true    Ran Out of Food in the Last Year: Never true  Transportation Needs: No Transportation Needs (08/17/2022)   PRAPARE - Hydrologist (Medical): No    Lack of Transportation (Non-Medical): No  Physical Activity: Sufficiently Active (08/17/2022)   Exercise Vital Sign    Days of Exercise per Week: 7 days    Minutes of Exercise per Session: 60 min  Stress: No Stress Concern Present (08/17/2022)   [place]    Feeling of Stress : Not at all  Social Connections: [place] (08/17/2022)   Social Connection and Isolation Panel [NHANES]    Frequency of Communication with Friends and Family: More than three times a week    Frequency  of Social Gatherings with Friends and Family: More than three times a week    Attends Religious Services: More than 4 times per year    Active Member of Clubs or Organizations: Yes    Attends Music therapist: More than 4 times per year    Marital Status: Married    Family History  Problem Relation Age of Onset   Cancer Mother        bladder cancer   Cancer Father        colon cancer   Colon cancer Father 87   Esophageal cancer Neg Hx    Rectal cancer Neg Hx    Stomach cancer Neg Hx     Health Maintenance  Topic Date Due   Zoster Vaccines- Shingrix (1 of 2) 11/16/2022 (Originally [birth year]29/1975)   INFLUENZA VACCINE  11/27/2022 (Originally  03/29/2022)   Pneumonia Vaccine 83+ Years old (1 - PCV) 08/18/2023 (Originally 01/30/2019)   Hepatitis C Screening  08/18/2023 (Originally 01/30/1972)   COVID-19 Vaccine (3 - Pfizer risk series) 10/07/2023 (Originally 10/26/2019)   Medicare Annual Wellness (AWV)  08/18/2023   MAMMOGRAM  03/30/2024   DTaP/Tdap/Td (3 - Td or Tdap) 04/18/2026   COLONOSCOPY (Pts 45-20yr Insurance coverage will need to be confirmed)  10/21/2031   DEXA SCAN  Completed   HPV VACCINES  Aged Out     ----------------------------------------------------------------------------------------------------------------------------------------------------------------------------------------------------------------- Physical Exam BP (!) 178/92 (BP Location: Left Arm, Patient Position: Sitting, Cuff Size: Normal)   Pulse (!) 116   Ht 5' 5.5" (1.664 m)   Wt 132 lb 8 oz (60.1 kg)   LMP 08/29/2008   SpO2 100%   BMI 21.71 kg/m   Physical Exam Constitutional:      Appearance: Normal appearance.  Eyes:     General: No scleral icterus. Neck:     Comments: Slight enlargement of the right hemithyroid. Musculoskeletal:     Cervical back: Neck supple.  Neurological:     Mental Status: She is alert.  Psychiatric:        Mood and Affect: Mood normal.        Behavior: Behavior normal.     ------------------------------------------------------------------------------------------------------------------------------------------------------------------------------------------------------------------- Assessment and Plan  Osteoporosis Seen by endocrinology previously.  Updating labs today.  We discussed optimizing vitamin D and calcium intake.  She is interested in more natural ways for improvement of her bone density.  We discussed weightbearing exercises to help maintain current bone mass.    Elevated TSH Recheck TSH, free T4 and obtain ultrasound for thyroid nodule.   No orders of the defined types were placed in this  encounter.   No follow-ups on file.    This visit occurred during the SARS-CoV-2 public health emergency.  Safety protocols were in place, including screening questions prior to the visit, additional usage of staff PPE, and extensive cleaning of exam room while observing appropriate contact time as indicated for disinfecting solutions.

## 2022-09-21 LAB — VITAMIN D 25 HYDROXY (VIT D DEFICIENCY, FRACTURES): Vit D, 25-Hydroxy: 37 ng/mL (ref 30–100)

## 2022-09-21 LAB — TSH: TSH: 6.88 mIU/L — ABNORMAL HIGH (ref 0.40–4.50)

## 2022-09-21 LAB — PTH, INTACT AND CALCIUM
Calcium: 9.5 mg/dL (ref 8.6–10.4)
PTH: 33 pg/mL (ref 16–77)

## 2022-09-21 LAB — T4, FREE: Free T4: 1.2 ng/dL (ref 0.8–1.8)

## 2022-09-21 LAB — C-TERMINAL TELOPEPTIDE: C-Telopeptide (CTx): 398 pg/mL

## 2022-09-22 ENCOUNTER — Ambulatory Visit (INDEPENDENT_AMBULATORY_CARE_PROVIDER_SITE_OTHER): Payer: Medicare Other

## 2022-09-22 DIAGNOSIS — R7989 Other specified abnormal findings of blood chemistry: Secondary | ICD-10-CM

## 2022-09-22 DIAGNOSIS — E041 Nontoxic single thyroid nodule: Secondary | ICD-10-CM

## 2022-09-26 ENCOUNTER — Other Ambulatory Visit: Payer: Medicare Other

## 2022-09-29 ENCOUNTER — Encounter: Payer: Self-pay | Admitting: Family Medicine

## 2022-12-29 ENCOUNTER — Encounter: Payer: Self-pay | Admitting: Obstetrics and Gynecology

## 2023-01-10 ENCOUNTER — Ambulatory Visit (INDEPENDENT_AMBULATORY_CARE_PROVIDER_SITE_OTHER): Payer: Medicare Other | Admitting: Family Medicine

## 2023-01-10 ENCOUNTER — Encounter: Payer: Self-pay | Admitting: Family Medicine

## 2023-01-10 VITALS — BP 174/95 | HR 112 | Ht 65.5 in | Wt 134.0 lb

## 2023-01-10 DIAGNOSIS — R221 Localized swelling, mass and lump, neck: Secondary | ICD-10-CM

## 2023-01-10 LAB — CBC WITH DIFFERENTIAL/PLATELET
Basophils Absolute: 42 cells/uL (ref 0–200)
Basophils Relative: 0.9 %
Lymphs Abs: 1481 cells/uL (ref 850–3900)
MCH: 28.1 pg (ref 27.0–33.0)
MCHC: 33.3 g/dL (ref 32.0–36.0)
MPV: 9.6 fL (ref 7.5–12.5)
Neutro Abs: 2782 cells/uL (ref 1500–7800)
Neutrophils Relative %: 59.2 %

## 2023-01-10 NOTE — Progress Notes (Signed)
Melissa Russell - 67 y.o. female MRN [id number]  Date of birth: 1956/07/24  Subjective Chief Complaint  Patient presents with   swollen neck    HPI Melissa Russell is a 67 y.o. female here today with complaint of lump in her neck.  She first noticed this a couple of days ago.  Initially noticed a nodule on the R side, today noticed 2 nodules on the L side.  Area is non-tender.  History of elevated TSH with stable thyroid nodule on 09/22/22.  Also noted to have hypoechoic solid structures posterior to thyroid bilaterally.  She denies fever, chills, sore throat, ear pain or congestion. Nodules are not tender.   ROS:  A comprehensive ROS was completed and negative except as noted per HPI  Allergies  Allergen Reactions   Augmentin [Amoxicillin-Pot Clavulanate]     Causes thrush   Clindamycin/Lincomycin    Limonene     Past Medical History:  Diagnosis Date   Dental disease    having implants d/t tooth growing up into sinuses   Heart valve disorder    "leaking"   Hypertension    Osteoporosis    Vitreomacular adhesion of both eyes 02/11/2021   Likely physiologic at this time    Past Surgical History:  Procedure Laterality Date   cararact surgery Left    COLONOSCOPY  07/12/2016   Dr.Gessner    Social History   Socioeconomic History   Marital status: Married    Spouse name: Renae Fickle   Number of children: 0   Years of education: 18   Highest education level: Master's degree (e.g., MA, MS, MEng, MEd, MSW, MBA)  Occupational History   Occupation: Retired.  Tobacco Use   Smoking status: Never   Smokeless tobacco: Never  Vaping Use   Vaping Use: Never used  Substance and Sexual Activity   Alcohol use: No    Alcohol/week: 0.0 standard drinks of alcohol   Drug use: No   Sexual activity: Not Currently    Partners: Male    Birth control/protection: Post-menopausal    Comment: older than 16, less than 5  Other Topics Concern   Not on file  Social History Narrative   Lives  with her husband. She enjoys doing a lot of genealogy research, dancing, reading and fitness class.    Social Determinants of Health   Financial Resource Strain: Low Risk  (08/17/2022)   Overall Financial Resource Strain (CARDIA)    Difficulty of Paying Living Expenses: Not hard at all  Food Insecurity: No Food Insecurity (08/17/2022)   Hunger Vital Sign    Worried About Running Out of Food in the Last Year: Never true    Ran Out of Food in the Last Year: Never true  Transportation Needs: No Transportation Needs (08/17/2022)   PRAPARE - Administrator, Civil Service (Medical): No    Lack of Transportation (Non-Medical): No  Physical Activity: Sufficiently Active (08/17/2022)   Exercise Vital Sign    Days of Exercise per Week: 7 days    Minutes of Exercise per Session: 60 min  Stress: No Stress Concern Present (08/17/2022)   [employer] of Occupational Health - Occupational Stress Questionnaire    Feeling of Stress : Not at all  Social Connections: Socially Integrated (08/17/2022)   Social Connection and Isolation Panel [NHANES]    Frequency of Communication with Friends and Family: More than three times a week    Frequency of Social Gatherings with Friends and Family: More than  three times a week    Attends Religious Services: More than 4 times per year    Active Member of Clubs or Organizations: Yes    Attends Banker Meetings: More than 4 times per year    Marital Status: Married    Family History  Problem Relation Age of Onset   Cancer Mother        bladder cancer   Cancer Father        colon cancer   Colon cancer Father 76   Esophageal cancer Neg Hx    Rectal cancer Neg Hx    Stomach cancer Neg Hx     Health Maintenance  Topic Date Due   Zoster Vaccines- Shingrix (1 of 2) 04/12/2023 (Originally 01/29/1973)   Pneumonia Vaccine 84+ Years old (1 of 1 - PCV) 08/18/2023 (Originally 01/30/2019)   Hepatitis C Screening  08/18/2023 (Originally  01/30/1972)   COVID-19 Vaccine (3 - Pfizer risk series) 10/07/2023 (Originally 10/26/2019)   INFLUENZA VACCINE  03/30/2023   Medicare Annual Wellness (AWV)  08/18/2023   MAMMOGRAM  03/30/2024   DTaP/Tdap/Td (3 - Td or Tdap) 04/18/2026   COLONOSCOPY (Pts 45-48yrs Insurance coverage will need to be confirmed)  10/21/2031   DEXA SCAN  Completed   HPV VACCINES  Aged Out     ----------------------------------------------------------------------------------------------------------------------------------------------------------------------------------------------------------------- Physical Exam BP (!) 183/80 (BP Location: Left Arm, Patient Position: Sitting, Cuff Size: Normal)   Pulse (!) 112   Ht 5' 5.5" (1.664 m)   Wt 134 lb (60.8 kg)   LMP 08/29/2008   SpO2 100%   BMI 21.96 kg/m   Physical Exam Constitutional:      Appearance: Normal appearance.  HENT:     Head: Normocephalic and atraumatic.  Eyes:     General: No scleral icterus. Neck:     Comments: 1 ovoid nodule R neck, 2 ovoid nodules L neck.   Cardiovascular:     Rate and Rhythm: Normal rate and regular rhythm.  Pulmonary:     Effort: Pulmonary effort is normal.     Breath sounds: Normal breath sounds.  Neurological:     General: No focal deficit present.     Mental Status: She is alert.  Psychiatric:        Mood and Affect: Mood normal.        Behavior: Behavior normal.     ------------------------------------------------------------------------------------------------------------------------------------------------------------------------------------------------------------------- Assessment and Plan  Nodule of neck Orders Placed This Encounter  Procedures   US Soft Tissue Head/Neck (NON-THYROID)    Standing Status:   Future    Standing Expiration Date:   01/10/2024    Order Specific Question:   Reason for Exam (SYMPTOM  OR DIAGNOSIS REQUIRED)    Answer:   nodule in neck    Order Specific Question:    Preferred imaging location?    Answer:   [facility]   Basic Metabolic Panel (BMET)   CBC with Differential   Sed Rate (ESR)   C-reactive protein     No orders of the defined types were placed in this encounter.   No follow-ups on file.    This visit occurred during the SARS-CoV-2 public health emergency.  Safety protocols were in place, including screening questions prior to the visit, additional usage of staff PPE, and extensive cleaning of exam room while observing appropriate contact time as indicated for disinfecting solutions.

## 2023-01-10 NOTE — Assessment & Plan Note (Signed)
Orders Placed This Encounter  Procedures   US Soft Tissue Head/Neck (NON-THYROID)    Standing Status:   Future    Standing Expiration Date:   01/10/2024    Order Specific Question:   Reason for Exam (SYMPTOM  OR DIAGNOSIS REQUIRED)    Answer:   nodule in neck    Order Specific Question:   Preferred imaging location?    Answer:   [facility]   Basic Metabolic Panel (BMET)   CBC with Differential   Sed Rate (ESR)   C-reactive protein

## 2023-01-11 LAB — CBC WITH DIFFERENTIAL/PLATELET
Absolute Monocytes: 282 cells/uL (ref 200–950)
Eosinophils Absolute: 113 cells/uL (ref 15–500)
Eosinophils Relative: 2.4 %
HCT: 41.8 % (ref 35.0–45.0)
Hemoglobin: 13.9 g/dL (ref 11.7–15.5)
MCV: 84.6 fL (ref 80.0–100.0)
Monocytes Relative: 6 %
Platelets: 238 10*3/uL (ref 140–400)
RBC: 4.94 10*6/uL (ref 3.80–5.10)
RDW: 12.8 % (ref 11.0–15.0)
Total Lymphocyte: 31.5 %
WBC: 4.7 10*3/uL (ref 3.8–10.8)

## 2023-01-11 LAB — BASIC METABOLIC PANEL
BUN: 12 mg/dL (ref 7–25)
CO2: 25 mmol/L (ref 20–32)
Calcium: 9.9 mg/dL (ref 8.6–10.4)
Chloride: 103 mmol/L (ref 98–110)
Creat: 0.57 mg/dL (ref 0.50–1.05)
Glucose, Bld: 109 mg/dL — ABNORMAL HIGH (ref 65–99)
Potassium: 4 mmol/L (ref 3.5–5.3)
Sodium: 140 mmol/L (ref 135–146)

## 2023-01-11 LAB — SEDIMENTATION RATE: Sed Rate: 9 mm/h (ref 0–30)

## 2023-01-11 LAB — C-REACTIVE PROTEIN: CRP: <3 mg/L (ref <8.0)

## 2023-01-12 ENCOUNTER — Ambulatory Visit (INDEPENDENT_AMBULATORY_CARE_PROVIDER_SITE_OTHER): Payer: Medicare Other

## 2023-01-12 DIAGNOSIS — R221 Localized swelling, mass and lump, neck: Secondary | ICD-10-CM

## 2023-01-19 ENCOUNTER — Other Ambulatory Visit: Payer: Self-pay | Admitting: Obstetrics and Gynecology

## 2023-01-19 DIAGNOSIS — Z1231 Encounter for screening mammogram for malignant neoplasm of breast: Secondary | ICD-10-CM

## 2023-04-03 ENCOUNTER — Ambulatory Visit
Admission: RE | Admit: 2023-04-03 | Discharge: 2023-04-03 | Disposition: A | Discharge status: Home / Self Care | Payer: Medicare Other | Source: Ambulatory Visit | Attending: Obstetrics and Gynecology | Admitting: Obstetrics and Gynecology

## 2023-04-03 DIAGNOSIS — Z1231 Encounter for screening mammogram for malignant neoplasm of breast: Secondary | ICD-10-CM

## 2023-06-12 ENCOUNTER — Encounter: Payer: Self-pay | Admitting: Family Medicine

## 2023-06-12 DIAGNOSIS — E559 Vitamin D deficiency, unspecified: Secondary | ICD-10-CM

## 2023-06-12 DIAGNOSIS — R7989 Other specified abnormal findings of blood chemistry: Secondary | ICD-10-CM

## 2023-06-12 DIAGNOSIS — R7309 Other abnormal glucose: Secondary | ICD-10-CM

## 2023-06-17 LAB — HEMOGLOBIN A1C
Est. average glucose Bld gHb Est-mCnc: 123 mg/dL
Hgb A1c MFr Bld: 5.9 % — ABNORMAL HIGH (ref 4.8–5.6)

## 2023-06-17 LAB — TSH+FREE T4
Free T4: 1.14 ng/dL (ref 0.82–1.77)
TSH: 6.45 u[IU]/mL — ABNORMAL HIGH (ref 0.450–4.500)

## 2023-06-17 LAB — VITAMIN D 25 HYDROXY (VIT D DEFICIENCY, FRACTURES): Vit D, 25-Hydroxy: 60 ng/mL (ref 30.0–100.0)

## 2023-09-27 NOTE — Progress Notes (Signed)
68 y.o. G0P0000 Married Caucasian female here for a breast and pelvic exam.     No GYN concern.   Wants me to check her skin on her back.   The patient is also followed for severe osteoporosis.  Referred to Dr. Talmage Nap.  She has declined treatment.   Takes vit D.   Has TSH 6.45 and free T4 1.14 on 06/16/23.  PCP following.   Volunteering at [facility].  Singing a lot.   PCP: Everrett Coombe, DO   Patient's last menstrual period was 08/29/2008.           Sexually active: No.  The current method of family planning is post menopausal status.    Menopausal hormone therapy:  n/a Exercising: Yes.     Fitness class everyday, walking,dancing Smoker:  no  OB History     Gravida  0   Para  0   Term  0   Preterm  0   AB  0   Living  0      SAB  0   IAB  0   Ectopic  0   Multiple  0   Live Births              HEALTH MAINTENANCE: Last 2 paps: 10/01/21 neg: HR HPV neg, 04/24/19 neg History of abnormal Pap or positive HPV:  no Mammogram:  04/03/23 Breast Density Cat D, BI-RADS CAT 1 neg Colonoscopy:  10/20/21 polyp - due in 2033. Bone Density:  03/30/22  Result  osteoporosis   Immunization History  Administered Date(s) Administered   PFIZER(Purple Top)SARS-COV-2 Vaccination 09/09/2019, 09/28/2019   Td 01/02/2006   Tdap 04/18/2016      reports that she has never smoked. She has never used smokeless tobacco. She reports that she does not drink alcohol and does not use drugs.  Past Medical History:  Diagnosis Date   Dental disease    having implants d/t tooth growing up into sinuses   Heart valve disorder    "leaking"   Hypertension    Osteoporosis    Vitreomacular adhesion of both eyes 02/11/2021   Likely physiologic at this time    Past Surgical History:  Procedure Laterality Date   cararact surgery Left    COLONOSCOPY  07/12/2016   Dr.Gessner    Current Outpatient Medications  Medication Sig Dispense Refill   cholecalciferol (VITAMIN D3) 25  MCG (1000 UNIT) tablet Take 1,000 Units by mouth daily.     ketorolac (ACULAR) 0.5 % ophthalmic solution Place 1 drop into the left eye in the morning and at bedtime.     No current facility-administered medications for this visit.    ALLERGIES: Augmentin [amoxicillin-pot clavulanate], Clindamycin/lincomycin, and Limonene  Family History  Problem Relation Age of Onset   Cancer Mother        bladder cancer   Cancer Father        colon cancer   Colon cancer Father 35   Esophageal cancer Neg Hx    Rectal cancer Neg Hx    Stomach cancer Neg Hx     Review of Systems  All other systems reviewed and are negative.   PHYSICAL EXAM:  BP 132/70 (BP Location: Right Arm, Patient Position: Sitting, Cuff Size: Small)   Pulse (!) 128   Ht 5\' 7"  (1.702 m)   Wt 132 lb (59.9 kg)   LMP 08/29/2008   SpO2 98%   BMI 20.67 kg/m     General appearance: alert, cooperative and  appears stated age Head: normocephalic, without obvious abnormality, atraumatic Neck: no adenopathy, supple, symmetrical, trachea midline and thyroid normal to inspection and palpation Lungs: clear to auscultation bilaterally Breasts: normal appearance, no masses or tenderness, No nipple retraction or dimpling, No nipple discharge or bleeding, No axillary adenopathy Heart: regular rate and rhythm Abdomen: soft, non-tender; no masses, no organomegaly Extremities: extremities normal, atraumatic, no cyanosis or edema Skin: skin color, texture, turgor normal. No rashes or lesions.  Cherry hemangiomas, seborrheic keratosis, sebaceous cyst 5 mm central back.  Lymph nodes: cervical, supraclavicular, and axillary nodes normal. Neurologic: grossly normal  Pelvic: External genitalia:  no lesions              No abnormal inguinal nodes palpated.              Urethra:  normal appearing urethra with no masses, tenderness or lesions              Bartholins and Skenes: normal                 Vagina: normal appearing vagina with normal  color and discharge, no lesions              Cervix: no lesions              Pap taken: No. Bimanual Exam:  Uterus:  normal size, contour, position, consistency, mobility, non-tender              Adnexa: no mass, fullness, tenderness              Rectal exam: Yes.  .  Confirms.              Anus:  normal sphincter tone, no lesions  Chaperone was present for exam:  Warren Lacy, CMA  ASSESSMENT: Encounter for breast and pelvic exam.  Osteoporosis without current fracture.  Had low PTH which normalized.  Possible subclinical hypothyroidism.  PCP following.   PLAN: Mammogram screening discussed. Self breast awareness reviewed. Pap and HRV collected:  No. Guidelines for Calcium, Vitamin D, regular exercise program including cardiovascular and weight bearing exercise. Medication refills:  NA Patient declines treatment for osteoporosis and accepts BMD for August, 2025.  Order placed.  She understands she is at risk for fracture.  Fall risk reduction reviewed, which the patient is already doing.  Follow up:  2 years and prn.    Additional counseling given.  yes. 20 min  total time was spent for this patient encounter, including preparation, face-to-face counseling with the patient, coordination of care, and documentation of the encounter regarding osteoporosis in addition to doing the breast and pelvic exam.

## 2023-10-11 ENCOUNTER — Ambulatory Visit: Payer: Medicare Other | Admitting: Obstetrics and Gynecology

## 2023-10-11 ENCOUNTER — Encounter: Payer: Self-pay | Admitting: Obstetrics and Gynecology

## 2023-10-11 VITALS — BP 132/70 | HR 128 | Ht 67.0 in | Wt 132.0 lb

## 2023-10-11 DIAGNOSIS — M810 Age-related osteoporosis without current pathological fracture: Secondary | ICD-10-CM

## 2023-10-11 DIAGNOSIS — Z01419 Encounter for gynecological examination (general) (routine) without abnormal findings: Secondary | ICD-10-CM

## 2023-10-11 NOTE — Patient Instructions (Signed)

## 2024-02-22 ENCOUNTER — Other Ambulatory Visit: Payer: Self-pay | Admitting: Obstetrics and Gynecology

## 2024-02-22 DIAGNOSIS — Z1231 Encounter for screening mammogram for malignant neoplasm of breast: Secondary | ICD-10-CM

## 2024-04-01 ENCOUNTER — Ambulatory Visit (HOSPITAL_BASED_OUTPATIENT_CLINIC_OR_DEPARTMENT_OTHER)
Admission: RE | Admit: 2024-04-01 | Discharge: 2024-04-01 | Disposition: A | Discharge status: Home / Self Care | Source: Ambulatory Visit | Attending: Obstetrics and Gynecology | Admitting: Obstetrics and Gynecology

## 2024-04-01 DIAGNOSIS — M810 Age-related osteoporosis without current pathological fracture: Secondary | ICD-10-CM | POA: Insufficient documentation

## 2024-04-02 ENCOUNTER — Ambulatory Visit: Payer: Self-pay | Admitting: Obstetrics and Gynecology

## 2024-04-03 ENCOUNTER — Ambulatory Visit
Admission: RE | Admit: 2024-04-03 | Discharge: 2024-04-03 | Disposition: A | Discharge status: Home / Self Care | Source: Ambulatory Visit | Attending: Obstetrics and Gynecology | Admitting: Obstetrics and Gynecology

## 2024-04-03 DIAGNOSIS — Z1231 Encounter for screening mammogram for malignant neoplasm of breast: Secondary | ICD-10-CM

## 2024-04-06 ENCOUNTER — Ambulatory Visit: Payer: Self-pay | Admitting: Obstetrics and Gynecology

## 2024-05-14 ENCOUNTER — Ambulatory Visit: Payer: Self-pay | Admitting: Family Medicine

## 2024-05-14 DIAGNOSIS — E041 Nontoxic single thyroid nodule: Secondary | ICD-10-CM

## 2024-05-16 ENCOUNTER — Other Ambulatory Visit

## 2024-05-16 DIAGNOSIS — E041 Nontoxic single thyroid nodule: Secondary | ICD-10-CM

## 2024-05-17 ENCOUNTER — Ambulatory Visit: Payer: Self-pay | Admitting: Family Medicine

## 2024-06-04 ENCOUNTER — Other Ambulatory Visit: Payer: Medicare Other

## 2024-07-22 ENCOUNTER — Encounter: Payer: Self-pay | Admitting: Family Medicine

## 2024-07-22 DIAGNOSIS — R030 Elevated blood-pressure reading, without diagnosis of hypertension: Secondary | ICD-10-CM

## 2024-07-22 DIAGNOSIS — Z13220 Encounter for screening for lipoid disorders: Secondary | ICD-10-CM

## 2024-07-22 DIAGNOSIS — R7989 Other specified abnormal findings of blood chemistry: Secondary | ICD-10-CM

## 2024-07-22 DIAGNOSIS — R7303 Prediabetes: Secondary | ICD-10-CM

## 2024-07-22 DIAGNOSIS — M810 Age-related osteoporosis without current pathological fracture: Secondary | ICD-10-CM

## 2024-07-22 NOTE — Telephone Encounter (Signed)
 Copied from CRM #[id number]. Topic: Clinical - Lab/Test Results >> Jul 22, 2024  2:56 PM Emylou G wrote: Reason for CRM: Patient called.. make an appt but she wants to know if she can do bloodwork too.SABRA Pls call her?

## 2024-07-24 ENCOUNTER — Ambulatory Visit: Admitting: Family Medicine

## 2024-07-24 ENCOUNTER — Encounter: Payer: Self-pay | Admitting: Family Medicine

## 2024-07-24 VITALS — BP 150/97 | HR 124 | Ht 67.0 in | Wt 134.0 lb

## 2024-07-24 DIAGNOSIS — H35063 Retinal vasculitis, bilateral: Secondary | ICD-10-CM

## 2024-07-24 DIAGNOSIS — R030 Elevated blood-pressure reading, without diagnosis of hypertension: Secondary | ICD-10-CM

## 2024-07-24 DIAGNOSIS — R7989 Other specified abnormal findings of blood chemistry: Secondary | ICD-10-CM | POA: Diagnosis not present

## 2024-07-24 NOTE — Assessment & Plan Note (Signed)
BP remains elevated in clinic however readings at home are very good.  Continue home monitoring and let me know if >140/90 at home.

## 2024-07-24 NOTE — Assessment & Plan Note (Signed)
Rechecking TSH

## 2024-07-24 NOTE — Assessment & Plan Note (Signed)
 Followed by ophthalmology.  Stable at this time.

## 2024-07-24 NOTE — Progress Notes (Signed)
 Melissa Russell - 68 y.o. female MRN [id number]  Date of birth: [birth year]  Subjective Chief Complaint  Patient presents with   Annual Exam    HPI Melissa Russell is a 68 y.o. female here today for follow up visit.   She continues to see ophthalmology for history of vasculitis of the retina.  Stable at this time.  Remains on Ketorolac  drops.    BP is elevated today.  Normal at her yearly with Gyn and she does monitor at home with readings of 115-120/70's.  She denies symptoms related to HTN including chest pain, shortness of breath, palpitations, headache or vision changes.    ROS:  A comprehensive ROS was completed and negative except as noted per HPI  Allergies  Allergen Reactions   Augmentin [Amoxicillin -Pot Clavulanate]     Causes thrush   Clindamycin/Lincomycin     Past Medical History:  Diagnosis Date   Dental disease    having implants d/t tooth growing up into sinuses   Heart valve disorder    leaking   Hypertension    Osteoporosis    Vitreomacular adhesion of both eyes 02/11/2021   Likely physiologic at this time    Past Surgical History:  Procedure Laterality Date   cararact surgery Left    COLONOSCOPY  07/12/2016   Dr.Gessner    Social History   Socioeconomic History   Marital status: Married    Spouse name: Deward   Number of children: 0   Years of education: 18   Highest education level: Master's degree (e.g., MA, MS, MEng, MEd, MSW, MBA)  Occupational History   Occupation: Retired.  Tobacco Use   Smoking status: Never   Smokeless tobacco: Never  Vaping Use   Vaping status: Never Used  Substance and Sexual Activity   Alcohol use: No    Alcohol/week: 0.0 standard drinks of alcohol   Drug use: No   Sexual activity: Not Currently    Partners: Male    Birth control/protection: Post-menopausal    Comment: older than 16, less than 5  Other Topics Concern   Not on file  Social History Narrative   Lives with her husband. She enjoys doing a lot  of genealogy research, dancing, reading and fitness class.    Social Drivers of Corporate Investment Banker Strain: Low Risk  (08/17/2022)   Overall Financial Resource Strain (CARDIA)    Difficulty of Paying Living Expenses: Not hard at all  Food Insecurity: No Food Insecurity (08/17/2022)   Hunger Vital Sign    Worried About Running Out of Food in the Last Year: Never true    Ran Out of Food in the Last Year: Never true  Transportation Needs: No Transportation Needs (08/17/2022)   PRAPARE - Administrator, Civil Service (Medical): No    Lack of Transportation (Non-Medical): No  Physical Activity: Sufficiently Active (08/17/2022)   Exercise Vital Sign    Days of Exercise per Week: 7 days    Minutes of Exercise per Session: 60 min  Stress: No Stress Concern Present (08/17/2022)   [employer] of Occupational Health - Occupational Stress Questionnaire    Feeling of Stress : Not at all  Social Connections: Socially Integrated (08/17/2022)   Social Connection and Isolation Panel    Frequency of Communication with Friends and Family: More than three times a week    Frequency of Social Gatherings with Friends and Family: More than three times a week    Attends Religious Services:  More than 4 times per year    Active Member of Clubs or Organizations: Yes    Attends Banker Meetings: More than 4 times per year    Marital Status: Married    Family History  Problem Relation Age of Onset   Cancer Mother        bladder cancer   Cancer Father        colon cancer   Colon cancer Father 34   Esophageal cancer Neg Hx    Rectal cancer Neg Hx    Stomach cancer Neg Hx     Health Maintenance  Topic Date Due   Hepatitis C Screening  Never done   Medicare Annual Wellness (AWV)  08/18/2023   Zoster Vaccines- Shingrix (1 of 2) 10/24/2024 (Originally 01/29/1975)   Influenza Vaccine  11/26/2024 (Originally 03/29/2024)   Pneumococcal Vaccine: 50+ Years (1 of 1 - PCV)  07/24/2025 (Originally 01/30/2004)   COVID-19 Vaccine (3 - Pfizer risk series) 08/09/2025 (Originally 10/26/2019)   Mammogram  04/03/2026   DTaP/Tdap/Td (3 - Td or Tdap) 04/18/2026   Colonoscopy  10/21/2031   Bone Density Scan  Completed   Meningococcal B Vaccine  Aged Out     ----------------------------------------------------------------------------------------------------------------------------------------------------------------------------------------------------------------- Physical Exam BP (!) 150/97   Pulse (!) 124   Ht 5' 7 (1.702 m)   Wt 134 lb (60.8 kg)   LMP 08/29/2008   SpO2 99%   BMI 20.99 kg/m   Physical Exam Constitutional:      Appearance: Normal appearance.  HENT:     Head: Normocephalic and atraumatic.  Eyes:     General: No scleral icterus. Cardiovascular:     Rate and Rhythm: Normal rate and regular rhythm.  Pulmonary:     Effort: Pulmonary effort is normal.     Breath sounds: Normal breath sounds.  Musculoskeletal:     Cervical back: Neck supple.  Neurological:     Mental Status: She is alert.  Psychiatric:        Mood and Affect: Mood normal.        Behavior: Behavior normal.     ------------------------------------------------------------------------------------------------------------------------------------------------------------------------------------------------------------------- Assessment and Plan  Elevated blood pressure reading in office with white coat syndrome, without diagnosis of hypertension BP remains elevated in clinic however readings at home are very good.  Continue home monitoring and let me know if >140/90 at home.    Elevated TSH Rechecking TSH.   Retinal vasculitis of both eyes Followed by ophthalmology.  Stable at this time.    No orders of the defined types were placed in this encounter.   Return in about 1 year (around 07/24/2025) for f/u medications.

## 2024-07-31 LAB — CMP14+EGFR
ALT: 15 IU/L (ref 0–32)
AST: 14 IU/L (ref 0–40)
Albumin: 4.2 g/dL (ref 3.9–4.9)
Alkaline Phosphatase: 80 IU/L (ref 49–135)
BUN/Creatinine Ratio: 16 (ref 12–28)
BUN: 10 mg/dL (ref 8–27)
Bilirubin Total: 0.5 mg/dL (ref 0.0–1.2)
CO2: 22 mmol/L (ref 20–29)
Calcium: 9.5 mg/dL (ref 8.7–10.3)
Chloride: 104 mmol/L (ref 96–106)
Creatinine, Ser: 0.63 mg/dL (ref 0.57–1.00)
Globulin, Total: 2.1 g/dL (ref 1.5–4.5)
Glucose: 93 mg/dL (ref 70–99)
Potassium: 4.7 mmol/L (ref 3.5–5.2)
Sodium: 141 mmol/L (ref 134–144)
Total Protein: 6.3 g/dL (ref 6.0–8.5)
eGFR: 95 mL/min/1.73 (ref >59)

## 2024-07-31 LAB — CBC WITH DIFFERENTIAL/PLATELET
Basophils Absolute: 0.1 x10E3/uL (ref 0.0–0.2)
Basos: 1 %
EOS (ABSOLUTE): 0.3 x10E3/uL (ref 0.0–0.4)
Eos: 6 %
Hematocrit: 42.6 % (ref 34.0–46.6)
Hemoglobin: 13.7 g/dL (ref 11.1–15.9)
Immature Grans (Abs): 0 x10E3/uL (ref 0.0–0.1)
Immature Granulocytes: 0 %
Lymphocytes Absolute: 2.1 x10E3/uL (ref 0.7–3.1)
Lymphs: 45 %
MCH: 28.7 pg (ref 26.6–33.0)
MCHC: 32.2 g/dL (ref 31.5–35.7)
MCV: 89 fL (ref 79–97)
Monocytes Absolute: 0.4 x10E3/uL (ref 0.1–0.9)
Monocytes: 9 %
Neutrophils Absolute: 1.9 x10E3/uL (ref 1.4–7.0)
Neutrophils: 39 %
Platelets: 245 x10E3/uL (ref 150–450)
RBC: 4.78 x10E6/uL (ref 3.77–5.28)
RDW: 13 % (ref 11.7–15.4)
WBC: 4.7 x10E3/uL (ref 3.4–10.8)

## 2024-07-31 LAB — LIPID PANEL WITH LDL/HDL RATIO
Cholesterol, Total: 217 mg/dL — ABNORMAL HIGH (ref 100–199)
HDL: 83 mg/dL (ref >39)
LDL Chol Calc (NIH): 119 mg/dL — ABNORMAL HIGH (ref 0–99)
LDL/HDL Ratio: 1.4 ratio (ref 0.0–3.2)
Triglycerides: 85 mg/dL (ref 0–149)
VLDL Cholesterol Cal: 15 mg/dL (ref 5–40)

## 2024-07-31 LAB — TSH+FREE T4
Free T4: 1.15 ng/dL (ref 0.82–1.77)
TSH: 6.94 u[IU]/mL — ABNORMAL HIGH (ref 0.450–4.500)

## 2024-07-31 LAB — HEMOGLOBIN A1C
Est. average glucose Bld gHb Est-mCnc: 117 mg/dL
Hgb A1c MFr Bld: 5.7 % — ABNORMAL HIGH (ref 4.8–5.6)

## 2024-07-31 LAB — VITAMIN D 25 HYDROXY (VIT D DEFICIENCY, FRACTURES): Vit D, 25-Hydroxy: 48.6 ng/mL (ref 30.0–100.0)

## 2024-08-02 ENCOUNTER — Ambulatory Visit: Payer: Self-pay | Admitting: Family Medicine
